# Patient Record
Sex: Male | Born: 1940
Health system: Southern US, Community
[De-identification: ages and names within clinical notes are randomized; demographics above are authoritative.]

## PROBLEM LIST (undated history)

## (undated) DIAGNOSIS — E785 Hyperlipidemia, unspecified: Secondary | ICD-10-CM

## (undated) DIAGNOSIS — L709 Acne, unspecified: Secondary | ICD-10-CM

## (undated) DIAGNOSIS — C4490 Unspecified malignant neoplasm of skin, unspecified: Secondary | ICD-10-CM

## (undated) HISTORY — PX: POLYPECTOMY: SHX149

## (undated) HISTORY — DX: Unspecified malignant neoplasm of skin, unspecified: C44.90

## (undated) HISTORY — DX: Hyperlipidemia, unspecified: E78.5

## (undated) HISTORY — PX: COLONOSCOPY: SHX174

## (undated) HISTORY — DX: Acne, unspecified: L70.9

## (undated) HISTORY — PX: APPENDECTOMY: SHX54

---

## 1994-01-22 HISTORY — PX: OTHER SURGICAL HISTORY: SHX169

## 1999-07-17 ENCOUNTER — Encounter: Admission: RE | Admit: 1999-07-17 | Discharge: 1999-07-17 | Payer: Self-pay | Admitting: Dermatology

## 1999-07-17 ENCOUNTER — Encounter: Payer: Self-pay | Admitting: Dermatology

## 2006-05-07 ENCOUNTER — Ambulatory Visit: Payer: Self-pay | Admitting: Family Medicine

## 2006-05-07 ENCOUNTER — Encounter: Payer: Self-pay | Admitting: Family Medicine

## 2006-05-07 DIAGNOSIS — K644 Residual hemorrhoidal skin tags: Secondary | ICD-10-CM | POA: Insufficient documentation

## 2006-05-07 DIAGNOSIS — Z85820 Personal history of malignant melanoma of skin: Secondary | ICD-10-CM

## 2006-05-13 ENCOUNTER — Ambulatory Visit: Payer: Self-pay | Admitting: Family Medicine

## 2006-05-13 LAB — CONVERTED CEMR LAB
ALT: 26 units/L (ref 0–40)
AST: 28 units/L (ref 0–37)
Albumin: 4.2 g/dL (ref 3.5–5.2)
Alkaline Phosphatase: 41 units/L (ref 39–117)
BUN: 11 mg/dL (ref 6–23)
Bilirubin, Direct: 0.2 mg/dL (ref 0.0–0.3)
CO2: 30 meq/L (ref 19–32)
Calcium: 9.1 mg/dL (ref 8.4–10.5)
Chloride: 105 meq/L (ref 96–112)
Cholesterol: 170 mg/dL (ref 0–200)
Creatinine, Ser: 0.6 mg/dL (ref 0.4–1.5)
GFR calc Af Amer: 175 mL/min
GFR calc non Af Amer: 145 mL/min
Glucose, Bld: 131 mg/dL — ABNORMAL HIGH (ref 70–99)
HDL: 44.9 mg/dL (ref >39.0)
LDL Cholesterol: 116 mg/dL — ABNORMAL HIGH (ref 0–99)
PSA: 0.95 ng/mL (ref 0.10–4.00)
Potassium: 4.3 meq/L (ref 3.5–5.1)
Sodium: 141 meq/L (ref 135–145)
TSH: 1.35 microintl units/mL (ref 0.35–5.50)
Total Bilirubin: 0.9 mg/dL (ref 0.3–1.2)
Total CHOL/HDL Ratio: 3.8
Total Protein: 7 g/dL (ref 6.0–8.3)
Triglycerides: 44 mg/dL (ref 0–149)
VLDL: 9 mg/dL (ref 0–40)

## 2006-06-18 ENCOUNTER — Ambulatory Visit: Payer: Self-pay | Admitting: Family Medicine

## 2006-06-18 LAB — CONVERTED CEMR LAB
Creatinine,U: 126.9 mg/dL
Glucose, Bld: 123 mg/dL — ABNORMAL HIGH (ref 70–99)
Hgb A1c MFr Bld: 5.7 % (ref 4.6–6.0)
Microalb Creat Ratio: 5.5 mg/g (ref 0.0–30.0)
Microalb, Ur: 0.7 mg/dL (ref 0.0–1.9)

## 2006-06-20 ENCOUNTER — Ambulatory Visit: Payer: Self-pay | Admitting: Family Medicine

## 2006-06-20 DIAGNOSIS — E785 Hyperlipidemia, unspecified: Secondary | ICD-10-CM

## 2007-02-24 ENCOUNTER — Ambulatory Visit: Payer: Self-pay | Admitting: Family Medicine

## 2007-02-26 ENCOUNTER — Ambulatory Visit: Payer: Self-pay | Admitting: Family Medicine

## 2007-02-26 LAB — CONVERTED CEMR LAB
ALT: 26 units/L (ref 0–53)
AST: 20 units/L (ref 0–37)
Albumin: 4.2 g/dL (ref 3.5–5.2)
Alkaline Phosphatase: 54 units/L (ref 39–117)
BUN: 16 mg/dL (ref 6–23)
Basophils Absolute: 0 10*3/uL (ref 0.0–0.1)
Basophils Relative: 0.4 % (ref 0.0–1.0)
Bilirubin, Direct: 0.1 mg/dL (ref 0.0–0.3)
CO2: 30 meq/L (ref 19–32)
Calcium: 9.3 mg/dL (ref 8.4–10.5)
Chloride: 106 meq/L (ref 96–112)
Cholesterol: 164 mg/dL (ref 0–200)
Creatinine, Ser: 0.6 mg/dL (ref 0.4–1.5)
Creatinine,U: 141.6 mg/dL
Eosinophils Absolute: 0.2 10*3/uL (ref 0.0–0.6)
Eosinophils Relative: 4.9 % (ref 0.0–5.0)
GFR calc Af Amer: 174 mL/min
GFR calc non Af Amer: 144 mL/min
Glucose, Bld: 149 mg/dL — ABNORMAL HIGH (ref 70–99)
HCT: 43.2 % (ref 39.0–52.0)
HDL: 38.5 mg/dL — ABNORMAL LOW (ref >39.0)
Hemoglobin: 14.8 g/dL (ref 13.0–17.0)
LDL Cholesterol: 115 mg/dL — ABNORMAL HIGH (ref 0–99)
Lymphocytes Relative: 31.5 % (ref 12.0–46.0)
MCHC: 34.3 g/dL (ref 30.0–36.0)
MCV: 89.6 fL (ref 78.0–100.0)
Microalb Creat Ratio: 9.2 mg/g (ref 0.0–30.0)
Microalb, Ur: 1.3 mg/dL (ref 0.0–1.9)
Monocytes Absolute: 0.4 10*3/uL (ref 0.2–0.7)
Monocytes Relative: 9.7 % (ref 3.0–11.0)
Neutro Abs: 2.6 10*3/uL (ref 1.4–7.7)
Neutrophils Relative %: 53.5 % (ref 43.0–77.0)
Platelets: 162 10*3/uL (ref 150–400)
Potassium: 4.3 meq/L (ref 3.5–5.1)
RBC: 4.82 M/uL (ref 4.22–5.81)
RDW: 11.8 % (ref 11.5–14.6)
Sodium: 142 meq/L (ref 135–145)
TSH: 1.65 microintl units/mL (ref 0.35–5.50)
Total Bilirubin: 0.9 mg/dL (ref 0.3–1.2)
Total CHOL/HDL Ratio: 4.3
Total Protein: 7.1 g/dL (ref 6.0–8.3)
Triglycerides: 54 mg/dL (ref 0–149)
VLDL: 11 mg/dL (ref 0–40)
WBC: 4.6 10*3/uL (ref 4.5–10.5)

## 2007-02-27 ENCOUNTER — Ambulatory Visit: Payer: Self-pay | Admitting: Family Medicine

## 2007-03-13 ENCOUNTER — Ambulatory Visit: Payer: Self-pay | Admitting: Family Medicine

## 2007-03-14 ENCOUNTER — Encounter (INDEPENDENT_AMBULATORY_CARE_PROVIDER_SITE_OTHER): Payer: Self-pay | Admitting: *Deleted

## 2007-05-23 ENCOUNTER — Ambulatory Visit: Payer: Self-pay | Admitting: Family Medicine

## 2007-05-23 LAB — CONVERTED CEMR LAB
BUN: 11 mg/dL (ref 6–23)
CO2: 30 meq/L (ref 19–32)
Calcium: 9.1 mg/dL (ref 8.4–10.5)
Chloride: 105 meq/L (ref 96–112)
Creatinine, Ser: 0.6 mg/dL (ref 0.4–1.5)
GFR calc Af Amer: 174 mL/min
GFR calc non Af Amer: 144 mL/min
Glucose, Bld: 135 mg/dL — ABNORMAL HIGH (ref 70–99)
Hgb A1c MFr Bld: 5.8 % (ref 4.6–6.0)
PSA: 0.72 ng/mL (ref 0.10–4.00)
Potassium: 4 meq/L (ref 3.5–5.1)
Sodium: 142 meq/L (ref 135–145)

## 2007-05-27 ENCOUNTER — Ambulatory Visit: Payer: Self-pay | Admitting: Family Medicine

## 2007-09-26 ENCOUNTER — Ambulatory Visit: Payer: Self-pay | Admitting: Family Medicine

## 2007-09-26 LAB — CONVERTED CEMR LAB
Glucose, Bld: 128 mg/dL — ABNORMAL HIGH (ref 70–99)
Hgb A1c MFr Bld: 5.9 % (ref 4.6–6.0)

## 2007-10-01 ENCOUNTER — Ambulatory Visit: Payer: Self-pay | Admitting: Family Medicine

## 2008-03-01 ENCOUNTER — Ambulatory Visit: Payer: Self-pay | Admitting: Family Medicine

## 2008-03-01 LAB — CONVERTED CEMR LAB
ALT: 21 units/L (ref 0–53)
AST: 19 units/L (ref 0–37)
Albumin: 4.2 g/dL (ref 3.5–5.2)
Alkaline Phosphatase: 46 units/L (ref 39–117)
BUN: 15 mg/dL (ref 6–23)
Bilirubin, Direct: 0.2 mg/dL (ref 0.0–0.3)
CO2: 29 meq/L (ref 19–32)
Calcium: 9.1 mg/dL (ref 8.4–10.5)
Chloride: 99 meq/L (ref 96–112)
Cholesterol: 176 mg/dL (ref 0–200)
Creatinine, Ser: 0.6 mg/dL (ref 0.4–1.5)
Creatinine,U: 201 mg/dL
GFR calc Af Amer: 174 mL/min
GFR calc non Af Amer: 144 mL/min
Glucose, Bld: 146 mg/dL — ABNORMAL HIGH (ref 70–99)
HDL: 49.4 mg/dL (ref >39.0)
Hgb A1c MFr Bld: 5.9 % (ref 4.6–6.0)
LDL Cholesterol: 116 mg/dL — ABNORMAL HIGH (ref 0–99)
Microalb Creat Ratio: 6.5 mg/g (ref 0.0–30.0)
Microalb, Ur: 1.3 mg/dL (ref 0.0–1.9)
PSA: 0.84 ng/mL (ref 0.10–4.00)
Potassium: 3.7 meq/L (ref 3.5–5.1)
Sodium: 137 meq/L (ref 135–145)
TSH: 1.51 microintl units/mL (ref 0.35–5.50)
Total Bilirubin: 1.7 mg/dL — ABNORMAL HIGH (ref 0.3–1.2)
Total CHOL/HDL Ratio: 3.6
Total Protein: 6.9 g/dL (ref 6.0–8.3)
Triglycerides: 51 mg/dL (ref 0–149)
VLDL: 10 mg/dL (ref 0–40)

## 2008-03-04 ENCOUNTER — Ambulatory Visit: Payer: Self-pay | Admitting: Family Medicine

## 2008-03-04 DIAGNOSIS — Z8639 Personal history of other endocrine, nutritional and metabolic disease: Secondary | ICD-10-CM | POA: Insufficient documentation

## 2008-03-04 DIAGNOSIS — E119 Type 2 diabetes mellitus without complications: Secondary | ICD-10-CM

## 2008-03-19 ENCOUNTER — Ambulatory Visit: Payer: Self-pay | Admitting: Family Medicine

## 2008-03-19 LAB — CONVERTED CEMR LAB
OCCULT 1: NEGATIVE
OCCULT 2: NEGATIVE
OCCULT 3: NEGATIVE

## 2008-03-22 ENCOUNTER — Encounter (INDEPENDENT_AMBULATORY_CARE_PROVIDER_SITE_OTHER): Payer: Self-pay | Admitting: *Deleted

## 2008-04-12 ENCOUNTER — Ambulatory Visit: Payer: Self-pay | Admitting: Family Medicine

## 2008-04-12 LAB — CONVERTED CEMR LAB
ALT: 21 units/L (ref 0–53)
AST: 17 units/L (ref 0–37)

## 2008-04-19 ENCOUNTER — Telehealth: Payer: Self-pay | Admitting: Family Medicine

## 2008-06-07 ENCOUNTER — Ambulatory Visit: Payer: Self-pay | Admitting: Family Medicine

## 2008-06-07 LAB — CONVERTED CEMR LAB
ALT: 21 units/L (ref 0–53)
AST: 18 units/L (ref 0–37)
VLDL: 9.6 mg/dL (ref 0.0–40.0)

## 2008-06-10 ENCOUNTER — Ambulatory Visit: Payer: Self-pay | Admitting: Family Medicine

## 2008-06-30 ENCOUNTER — Encounter: Admission: RE | Admit: 2008-06-30 | Discharge: 2008-06-30 | Payer: Self-pay | Admitting: Family Medicine

## 2008-06-30 ENCOUNTER — Ambulatory Visit: Payer: Self-pay | Admitting: Family Medicine

## 2008-06-30 DIAGNOSIS — M6740 Ganglion, unspecified site: Secondary | ICD-10-CM | POA: Insufficient documentation

## 2008-07-21 ENCOUNTER — Ambulatory Visit: Payer: Self-pay | Admitting: Family Medicine

## 2008-09-01 ENCOUNTER — Telehealth: Payer: Self-pay | Admitting: Family Medicine

## 2008-09-08 ENCOUNTER — Ambulatory Visit: Payer: Self-pay | Admitting: Family Medicine

## 2008-09-08 LAB — CONVERTED CEMR LAB
ALT: 23 units/L (ref 0–53)
HDL: 50.7 mg/dL (ref >39.00)
Total CHOL/HDL Ratio: 3

## 2008-09-14 ENCOUNTER — Ambulatory Visit: Payer: Self-pay | Admitting: Family Medicine

## 2008-09-15 ENCOUNTER — Encounter: Payer: Self-pay | Admitting: Family Medicine

## 2009-03-29 ENCOUNTER — Ambulatory Visit: Payer: Self-pay | Admitting: Family Medicine

## 2009-03-29 LAB — CONVERTED CEMR LAB
Albumin: 4.3 g/dL (ref 3.5–5.2)
BUN: 11 mg/dL (ref 6–23)
Basophils Absolute: 0.1 10*3/uL (ref 0.0–0.1)
CO2: 29 meq/L (ref 19–32)
Chloride: 106 meq/L (ref 96–112)
Cholesterol: 138 mg/dL (ref 0–200)
Creatinine,U: 61.7 mg/dL
GFR calc non Af Amer: 142.92 mL/min (ref >60)
Glucose, Bld: 188 mg/dL — ABNORMAL HIGH (ref 70–99)
HCT: 43.8 % (ref 39.0–52.0)
Hgb A1c MFr Bld: 6.2 % (ref 4.6–6.5)
Lymphs Abs: 1.4 10*3/uL (ref 0.7–4.0)
MCHC: 33.6 g/dL (ref 30.0–36.0)
MCV: 92.8 fL (ref 78.0–100.0)
Microalb Creat Ratio: 11.3 mg/g (ref 0.0–30.0)
Monocytes Absolute: 0.5 10*3/uL (ref 0.1–1.0)
Platelets: 143 10*3/uL — ABNORMAL LOW (ref 150.0–400.0)
Potassium: 4.2 meq/L (ref 3.5–5.1)
RDW: 12.4 % (ref 11.5–14.6)
TSH: 2.32 microintl units/mL (ref 0.35–5.50)
Total Bilirubin: 0.9 mg/dL (ref 0.3–1.2)
VLDL: 16.4 mg/dL (ref 0.0–40.0)

## 2009-04-04 ENCOUNTER — Ambulatory Visit: Payer: Self-pay | Admitting: Family Medicine

## 2009-04-20 ENCOUNTER — Encounter (INDEPENDENT_AMBULATORY_CARE_PROVIDER_SITE_OTHER): Payer: Self-pay | Admitting: *Deleted

## 2009-04-20 ENCOUNTER — Ambulatory Visit: Payer: Self-pay | Admitting: Family Medicine

## 2009-04-20 LAB — CONVERTED CEMR LAB: OCCULT 3: NEGATIVE

## 2009-08-30 ENCOUNTER — Encounter (INDEPENDENT_AMBULATORY_CARE_PROVIDER_SITE_OTHER): Payer: Self-pay | Admitting: *Deleted

## 2009-10-04 ENCOUNTER — Ambulatory Visit: Payer: Self-pay | Admitting: Family Medicine

## 2009-10-04 ENCOUNTER — Encounter (INDEPENDENT_AMBULATORY_CARE_PROVIDER_SITE_OTHER): Payer: Self-pay | Admitting: *Deleted

## 2009-10-05 LAB — CONVERTED CEMR LAB
ALT: 25 units/L (ref 0–53)
AST: 22 units/L (ref 0–37)
BUN: 14 mg/dL (ref 6–23)
Bilirubin, Direct: 0.2 mg/dL (ref 0.0–0.3)
CO2: 31 meq/L (ref 19–32)
Chloride: 104 meq/L (ref 96–112)
Creatinine, Ser: 0.6 mg/dL (ref 0.4–1.5)
LDL Cholesterol: 104 mg/dL — ABNORMAL HIGH (ref 0–99)
Potassium: 4.7 meq/L (ref 3.5–5.1)
Total CHOL/HDL Ratio: 4
Total Protein: 7.1 g/dL (ref 6.0–8.3)
Triglycerides: 43 mg/dL (ref 0.0–149.0)

## 2009-12-08 ENCOUNTER — Encounter: Payer: Self-pay | Admitting: Family Medicine

## 2010-02-21 NOTE — Letter (Signed)
Summary: Results Follow up Letter  [place] at [facility]  [address] [place], [place] [address]   Phone: [phone]  Fax: [phone]    04/20/2009 MRN: [id number]    Roger [name] [address] [place], [place]  [address]    Dear [name],  The following are the results of your recent test(s):  Test         Result    Pap Smear:        Normal _____  Not Normal _____ Comments: ______________________________________________________ Cholesterol: LDL(Bad cholesterol):         Your goal is less than:         HDL (Good cholesterol):       Your goal is more than: Comments:  ______________________________________________________ Mammogram:        Normal _____  Not Normal _____ Comments:  ___________________________________________________________________ Hemoccult:        Normal __X___  Not normal _______ Comments:    Yearly follow up is recommended.   _____________________________________________________________________ Other Tests:    We routinely do not discuss normal results over the telephone.  If you desire a copy of the results, or you have any questions about this information we can discuss them at your next office visit.   Sincerely,    Arta Silence, M.D.  RNS:lsf

## 2010-02-21 NOTE — Miscellaneous (Signed)
  Clinical Lists Changes  Observations: Added new observation of COLONNXTDUE: 03/2012 (10/04/2009 8:57) Added new observation of COLONOSCOPY: Done (03/23/2002 9:00)      Preventive Care Screening  Colonoscopy:    Date:  03/23/2002    Next Due:  03/2012    Results:  Done

## 2010-02-21 NOTE — Assessment & Plan Note (Signed)
Summary: CPX//CYD   Vital Signs:  Patient profile:   70 year old male Weight:      186.75 pounds Temp:     98.7 degrees F oral Pulse rate:   68 / minute Pulse rhythm:   regular BP sitting:   124 / 74  (left arm) Cuff size:   regular  Vitals Entered By: Sydell Axon LPN (April 04, 2009 10:47 AM) CC: 30 Minute checkup, hemoccult cards given to patient, declined tetanus vaccine   History of Present Illness: Pt here for followup. He declines Td, says he has no risk of cutting himself. His granddaughter tells him he is fat. He has started avoiding calories. He wants to decrease his stomach. hew has a gizmo from the TV. He plans to avoid potatoes and bread.  Preventive Screening-Counseling & Management  Alcohol-Tobacco     Alcohol drinks/day: 0     Alcohol type: beer     Smoking Status: never     Passive Smoke Exposure: no  Caffeine-Diet-Exercise     Caffeine use/day: 0     Does Patient Exercise: yes     Type of exercise: walking some.     Times/week: 3  Problems Prior to Update: 1)  Ganglion Cyst, L A/c Shoulder Joint.  (ICD-727.43) 2)  Otitis Externa, Acute, Bilateral  (ICD-380.12) 3)  Aodm  (ICD-250.00) 4)  Special Screening Malignant Neoplasm of Prostate  (ICD-V76.44) 5)  Special Screening Malig Neoplasms Other Sites  (ICD-V76.49) 6)  Health Maintenance Exam  (ICD-V70.0) 7)  Hyperlipidemia  (ICD-272.4) 8)  Hemorrhoids, External  (ICD-455.3) 9)  Hx, Personal, Malignancy, Skin Melanoma  (ICD-V10.82)  Medications Prior to Update: 1)  Pravastatin Sodium 40 Mg Tabs (Pravastatin Sodium) .... 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 Eyes:  Denies blurring, discharge, and eye pain. ENT:  Denies decreased hearing, earache, and ringing in ears. CV:  Denies chest pain or discomfort, fainting, fatigue, palpitations, shortness of breath with exertion, swelling of feet, and swelling of hands. Resp:  Denies cough, shortness of breath, and wheezing. GI:  Complains of hemorrhoids; denies abdominal pain, bloody stools, change in bowel habits, constipation, dark tarry stools, diarrhea, indigestion, loss of appetite, nausea, vomiting, vomiting blood, and yellowish skin color. GU:  Denies dysuria, nocturia, urinary frequency, and urinary hesitancy. MS:  Denies joint pain, loss of strength, mid back pain, muscle, cramps, and stiffness. Derm:  Complains of rash; denies dryness, itching, and lesion(s); sees Dr Terri Piedra. Has acne. Neuro:  Denies numbness, poor balance, tingling, and tremors.  Physical Exam  General:  Well-developed,well-nourished,in no acute distress; alert,appropriate and cooperative throughout examination,  overweight abdominally. Head:  Normocephalic and atraumatic without obvious abnormalities. No apparent alopecia or balding. Eyes:  Conjunctiva clear bilaterally.  Ears:  External ear exam shows no significant lesions or deformities.  Otoscopic examination reveals clear canals, tympanic membranes are intact  bilaterally without bulging, retraction, inflammation or discharge. Hearing is grossly normal bilaterally. Nose:  External nasal examination shows no deformity or inflammation. Nasal mucosa are pink and moist without lesions or exudates. Mouth:  Oral mucosa and oropharynx without lesions or exudates.  Teeth in good repair. Neck:  No deformities, masses, or tenderness noted. Chest Wall:  No deformities, masses, tenderness or gynecomastia noted. Breasts:  No masses or gynecomastia noted Lungs:  Normal respiratory effort, chest expands symmetrically. Lungs are clear to auscultation, no crackles or wheezes. Heart:  Normal rate and regular rhythm. S1 and S2 normal without gallop, murmur, click, rub or other extra sounds. Abdomen:  Bowel sounds positive,abdomen soft and non-tender without masses, organomegaly or hernias noted. Protuberant without Panniculous. Rectal:  No external abnormalities noted. Normal sphincter tone. No rectal masses or tenderness. Ext hemms moderately inflamed. Genitalia:  Testes bilaterally descended without nodularity, tenderness or masses. No scrotal masses or lesions. No penis lesions or urethral discharge. Prostate:  Prostate gland firm and smooth, no enlargement, nodularity, tenderness, mass, asymmetry or induration. 20-30gms. Msk:  No deformity or scoliosis noted of thoracic or lumbar spine.  L shoulder symmetrical again. Pulses:  R and L carotid,radial,femoral,dorsalis pedis and posterior tibial pulses are full and equal bilaterally Extremities:  No clubbing, cyanosis, edema, or deformity noted with normal full range of motion of all joints.   Neurologic:  No cranial nerve deficits noted. Station and gait are normal. Sensory, motor and coordinative functions appear intact. Skin:  Intact without suspicious lesions or rashes except mild acne. Cervical Nodes:  No lymphadenopathy noted Inguinal Nodes:  No significant adenopathy Psych:  Cognition and judgment appear intact.  Alert and cooperative with normal attention span and concentration. No apparent delusions, illusions, hallucinations   Impression & Recommendations:  Problem # 1:  AODM (ICD-250.00) Assessment Unchanged Stable but acutely elevated.  Problem # 2:  SPECIAL SCREENING MALIGNANT NEOPLASM OF PROSTATE (ICD-V76.44) Assessment: Unchanged Stable exam and PSA.  Problem # 3:  HYPERLIPIDEMIA (ICD-272.4) Assessment: Improved  Good control. Cont curr meds. His updated medication list for this problem includes:    Pravastatin Sodium 40 Mg Tabs (Pravastatin sodium) ..... One tab by mouth at night  Labs Reviewed: SGOT: 23 (03/29/2009)   SGPT: 25 (03/29/2009)   HDL:49.80 (03/29/2009), 50.70 (09/08/2008)  LDL:72 (03/29/2009), 106 (16/10/9602)  Chol:138 (03/29/2009), 163 (09/08/2008)  Trig:82.0 (03/29/2009), 32.0 (09/08/2008)  Problem # 4:  HEMORRHOIDS, EXTERNAL (ICD-455.3) Assessment: Unchanged  Complete Medication List: 1)  Pravastatin Sodium 40 Mg Tabs (Pravastatin sodium) .... One tab by mouth at night  Current Allergies (reviewed today): No known allergies

## 2010-02-21 NOTE — Assessment & Plan Note (Signed)
Summary: F/U/DR SCHALLER'S PT/CLE   Vital Signs:  Patient profile:   70 year old male Height:      65 inches Weight:      186.75 pounds BMI:     31.19 Temp:     98.6 degrees F oral Pulse rate:   76 / minute Pulse rhythm:   regular BP sitting:   132 / 80  (left arm) Cuff size:   large  Vitals Entered By: Delilah Shan CMA Duncan Dull) (October 04, 2009 8:15 AM) CC: Follow-up visit per RNS   History of Present Illness: Elevated Cholesterol: Using medications without problems: yes Muscle aches: no Other complaints: no  Diabetes:  Using medications without difficulties:not on meds Hypoglycemic episodes: no symptoms  Hyperglycemic episodes:no symptoms  Feet problems:no Blood Sugars averaging:not checked eye exam within last year: has follow up pending soon  Allergies: No Known Drug Allergies  Past History:  Past Surgical History: Last updated: 05/07/2006 Appy 7th grade Melanoma excision of back (Dr Terri Piedra) 1996  Family History: Last updated: 10/04/2009 Father- estranged Mother dec 63 Lung Ca (smoker) Brother A- no contact Brother A- no contact Brother A - no contact Sister A- no contact  Social History: Last updated: 10/04/2009 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 Father- estranged Mother dec 63 Lung Ca (smoker) Brother A- no contact Brother A- no contact Brother A - no contact Sister A- no contact  Social History: Reviewed history from 05/07/2006 and no changes required. 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  Otherwise negative.    Physical Exam  General:  GEN: nad, alert and oriented HEENT: mucous membranes  moist NECK: supple w/o LA CV: rrr.  no murmur PULM: ctab, no inc wob ABD: soft, +bs EXT: no edema SKIN: no acute rash, healed scar noted on back  Diabetes Management Exam:    Foot Exam (with socks and/or shoes not present):       Sensory-Pinprick/Light touch:          Left medial foot (L-4): normal          Left dorsal foot (L-5): normal          Left lateral foot (S-1): normal          Right medial foot (L-4): normal          Right dorsal foot (L-5): normal          Right lateral foot (S-1): normal       Sensory-Monofilament:          Left foot: normal          Right foot: normal       Inspection:          Left foot: normal          Right foot: normal       Nails:          Left foot: normal          Right foot: normal   Impression & Recommendations:  Problem # 1:  AODM (ICD-250.00) check labs and see instructions.  >25 min spent with patient, at least half of which was spent on counseling re:dm2.  Orders:  TLB-BMP (Basic Metabolic Panel-BMET) (80048-METABOL) TLB-Hepatic/Liver Function Pnl (80076-HEPATIC) TLB-Lipid Panel (80061-LIPID) TLB-A1C / Hgb A1C (Glycohemoglobin) (83036-A1C)  Problem # 2:  HYPERLIPIDEMIA (ICD-272.4) Continue meds and check labs.   His updated medication list for this problem includes:    Pravastatin Sodium 40 Mg Tabs (Pravastatin sodium) ..... One tab by mouth at night  Complete Medication List: 1)  Pravastatin Sodium 40 Mg Tabs (Pravastatin sodium) .... One tab by mouth at night 2)  Doxycycline Hyclate 100 Mg Tabs (Doxycycline hyclate) .... Take 1 or 2 tablets by mouth once daily or acne flare ups  Patient Instructions: 1)  Walk at least 3 times a week. 2)  Cut down on bread, beer, and sweet tea.  3)  You can get your results through our phone system.  Follow the instructions on the blue card.  4)  I would get a physical in 6 months.   5)  Take care.  Glad to see you.   Current Allergies (reviewed today): No known allergies   Appended  Document: F/U/DR [facility] PT/CLE Flu Vaccine Consent Questions     Do you have a history of severe allergic reactions to this vaccine? no    Any prior history of allergic reactions to egg and/or gelatin? no    Do you have a sensitivity to the preservative Thimersol? no    Do you have a past history of Guillan-Barre Syndrome? no    Do you currently have an acute febrile illness? no    Have you ever had a severe reaction to latex? no    Vaccine information given and explained to patient? yes    Are you currently pregnant? no    Lot Number:[id number]   Exp Date:07/22/2010   Site Given  Left Deltoid IM Lugene Fuquay CMA (AAMA)  October 04, 2009 8:54 AM

## 2010-02-21 NOTE — Letter (Signed)
Summary: Nadara Eaton letter  [place] at [facility]  [address] [place], [place] [address]   Phone: [phone]  Fax: [phone]       08/30/2009 MRN: [id number]  Roger [name] [address] [place], [place]  [address]  Dear [name],  [facility] [place] [place] announce the retirement of Arta Silence, M.D., from full-time practice at the [facility] office effective July 21, 2009 and his plans of returning part-time.  It is important to Dr. Hetty Ely and to our practice that you understand that [facility] has seven physicians in our office for your health care needs.  We will continue to offer the same exceptional care that you have today.    Dr. Hetty Ely has spoken to many of you about his plans for retirement and returning part-time in the fall.   We will continue to work with you through the transition to schedule appointments for you in the office and meet the high standards that [facility] is committed to.   Again, it is with great pleasure that we share the news that Dr. Hetty Ely will return to [facility] in October of 2011 with a reduced schedule.    If you have any questions, or would like to request an appointment with one of our physicians, please call us at [phone] and press the option for Scheduling an appointment.  We take pleasure in providing you with excellent patient care and look forward to seeing you at your next office visit.  Our [facility] Physicians are:  Tillman Abide, M.D. Laurita Quint, M.D. Roxy Manns, M.D. Kerby Nora, M.D. Hannah Beat, M.D. Ruthe Mannan, M.D. We proudly welcomed Raechel Ache, M.D. and Eustaquio Boyden, M.D. to the practice in July/August 2011.  Sincerely,  [facility]

## 2010-03-31 ENCOUNTER — Telehealth (INDEPENDENT_AMBULATORY_CARE_PROVIDER_SITE_OTHER): Payer: Self-pay | Admitting: *Deleted

## 2010-04-03 ENCOUNTER — Other Ambulatory Visit: Payer: Self-pay | Admitting: Family Medicine

## 2010-04-03 ENCOUNTER — Other Ambulatory Visit (INDEPENDENT_AMBULATORY_CARE_PROVIDER_SITE_OTHER): Payer: Medicare Other

## 2010-04-03 ENCOUNTER — Encounter: Payer: Self-pay | Admitting: *Deleted

## 2010-04-03 DIAGNOSIS — E785 Hyperlipidemia, unspecified: Secondary | ICD-10-CM

## 2010-04-03 DIAGNOSIS — E119 Type 2 diabetes mellitus without complications: Secondary | ICD-10-CM

## 2010-04-03 DIAGNOSIS — Z125 Encounter for screening for malignant neoplasm of prostate: Secondary | ICD-10-CM

## 2010-04-03 LAB — LIPID PANEL
Cholesterol: 137 mg/dL (ref 0–200)
VLDL: 8.8 mg/dL (ref 0.0–40.0)

## 2010-04-03 LAB — HEPATIC FUNCTION PANEL
AST: 21 U/L (ref 0–37)
Albumin: 4.7 g/dL (ref 3.5–5.2)
Total Protein: 7.2 g/dL (ref 6.0–8.3)

## 2010-04-03 LAB — BASIC METABOLIC PANEL
BUN: 14 mg/dL (ref 6–23)
Calcium: 9.2 mg/dL (ref 8.4–10.5)
GFR: 164.41 mL/min (ref >60.00)
Glucose, Bld: 120 mg/dL — ABNORMAL HIGH (ref 70–99)

## 2010-04-03 LAB — PSA: PSA: 0.94 ng/mL (ref 0.10–4.00)

## 2010-04-04 NOTE — Progress Notes (Signed)
----   Converted from flag ---- ---- 03/30/2010 1:48 PM, Crawford Givens MD wrote: psa v76.44 cmet/lipid/a1c 250.00  ---- 03/30/2010 7:54 AM, Liane Comber CMA (AAMA) wrote: Lab orders please! Good Morning! This pt is scheduled for cpx labs Monday, which labs to draw and dx codes to use? Thanks Tasha ------------------------------

## 2010-04-05 ENCOUNTER — Encounter: Payer: Self-pay | Admitting: Family Medicine

## 2010-04-05 ENCOUNTER — Encounter (INDEPENDENT_AMBULATORY_CARE_PROVIDER_SITE_OTHER): Payer: Medicare Other | Admitting: Family Medicine

## 2010-04-05 DIAGNOSIS — E785 Hyperlipidemia, unspecified: Secondary | ICD-10-CM

## 2010-04-05 DIAGNOSIS — Z125 Encounter for screening for malignant neoplasm of prostate: Secondary | ICD-10-CM

## 2010-04-05 DIAGNOSIS — E119 Type 2 diabetes mellitus without complications: Secondary | ICD-10-CM

## 2010-04-06 ENCOUNTER — Encounter: Payer: Self-pay | Admitting: Family Medicine

## 2010-04-11 ENCOUNTER — Encounter: Payer: Self-pay | Admitting: *Deleted

## 2010-04-11 NOTE — Assessment & Plan Note (Signed)
Summary: CPX/CLE   Vital Signs:  Patient profile:   70 year old male Height:      65 inches Weight:      177.75 pounds BMI:     29.69 Temp:     98.8 degrees F oral Pulse rate:   72 / minute Pulse rhythm:   regular BP sitting:   104 / 60  (left arm) Cuff size:   large  Vitals Entered By: Delilah Shan CMA Inocente Krach Dull) (April 05, 2010 1:49 PM) CC: CPX   History of Present Illness: Elevated Cholesterol: Using medications without problems:yes Muscle aches: no Other complaints:no  Feeling well w/o other complaints.  Asking for me to check his ears.  No hearing trouble.    Diabetes:  Using medications without difficulties:no meds Hypoglycemic episodes:no symptoms  Hyperglycemic episodes:no symptoms  Feet problems:no Blood Sugars averaging: not checked.   eye exam within last year: yes  Allergies: No Known Drug Allergies  Past History:  Past Surgical History: Last updated: 05/07/2006 Appy 7th grade Melanoma excision of back (Dr Terri Piedra) 1996  Past Medical History: HLD Acne- [place] DM2  Family History: Reviewed history from 10/04/2009 and no changes required. Father- estranged Mother dec 63 Lung Ca (smoker) Brother A- no contact Brother A- no contact Brother A - no contact Sister A- no contact  Social History: Reviewed history from 10/04/2009 and no changes required. 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  Otherwise negative.    Physical Exam  General:  GEN: nad, alert and oriented HEENT: mucous membranes moist NECK: supple w/o LA CV: rrr.  no murmur PULM: ctab, no inc wob ABD: soft, +bs EXT: no edema SKIN: no acute rash  Rectal:  No rectal masses or tenderness.  +external hemorrhoid(s).   Prostate:  Prostate gland firm and smooth, no enlargement, nodularity, tenderness, mass, asymmetry or  induration.  Diabetes Management Exam:    Foot Exam (with socks and/or shoes not present):       Sensory-Pinprick/Light touch:          Left medial foot (L-4): normal          Left dorsal foot (L-5): normal          Left lateral foot (S-1): normal          Right medial foot (L-4): normal          Right dorsal foot (L-5): normal          Right lateral foot (S-1): normal       Sensory-Monofilament:          Left foot: normal          Right foot: normal       Inspection:          Left foot: normal          Right foot: normal       Nails:          Left foot: normal          Right foot: normal   Impression & Recommendations:  Problem # 1:  HYPERLIPIDEMIA (ICD-272.4) labs reviewed with patient.  At goal.  continue diet and exercise.  Losing weight.   His updated medication list for this problem includes:    Pravastatin Sodium 40 Mg Tabs (Pravastatin sodium) ..... One tab by mouth at night  Orders: Prescription Created Electronically [phone])  Problem # 2:  AODM (ICD-250.00) labs reviewed with patient.  At goal.  continue diet and exercise.  Losing weight.   See plan.   Problem # 3:  Screening PSA (ICD-V76.44) normal DRE and psa not elevated.  consider recheck next year.   Complete Medication List: 1)  Pravastatin Sodium 40 Mg Tabs (Pravastatin sodium) .... One tab by mouth at night 2)  Doxycycline Hyclate 100 Mg Tabs (Doxycycline hyclate) .... Take 1 or 2 tablets by mouth once daily or acne flare ups  Patient Instructions: 1)  Check with your insurance to see if they will cover the shingles shot. I would get the singles shot, along with a tetanus and pneumonia shot. 2)  I would get another flu shot in the fall. 3)  Don't change your meds.   4)  Keep exercising.   5)  Take care and enjoy the beach.   6)  follow up in 6months.  .  A1c and lipid before OV.  250.00 Prescriptions: PRAVASTATIN SODIUM 40 MG TABS (PRAVASTATIN SODIUM) one tab by mouth at night  #90 x 3    Entered and Authorized by:   Crawford Givens MD   Signed by:   Crawford Givens MD on 04/05/2010   Method used:   Electronically to        [facility] [phone]* (retail)       [address]       [place], [place]  [address]       Ph: [phone]       Fax: [phone]   RxID:   [id number]    Orders Added: 1)  Est. Patient Level IV [27253] 2)  Prescription Created Electronically [phone]    Current Allergies (reviewed today): No known allergies    Prevention & Chronic Care Immunizations   Influenza vaccine: Fluvax 3+  (10/04/2009)    Tetanus booster: Not documented    Pneumococcal vaccine: Not documented    H. zoster vaccine: Not documented  Colorectal Screening   Hemoccult: negative  (04/20/2009)    Colonoscopy: Done  (03/23/2002)   Colonoscopy due: 03/2012  Other Screening   PSA: 0.94  (04/03/2010)   Smoking status: never  (04/04/2009)  Diabetes Mellitus   HgbA1C: 6.0  (04/03/2010)    Eye exam: normal  (12/02/2007)    Foot exam: yes  (04/05/2010)   High risk foot: Not documented   Foot care education: Not documented    Urine microalbumin/creatinine ratio: 11.3  (03/29/2009)  Lipids   Total Cholesterol: 137  (04/03/2010)   LDL: 82  (04/03/2010)   LDL Direct: Not documented   HDL: 46.40  (04/03/2010)   Triglycerides: 44.0  (04/03/2010)    SGOT (AST): 21  (04/03/2010)   SGPT (ALT): 24  (04/03/2010)   Alkaline phosphatase: 46  (04/03/2010)   Total bilirubin: 1.0  (04/03/2010)  Self-Management Support :    Diabetes self-management support: Not documented    Lipid self-management support: Not documented

## 2010-04-11 NOTE — Letter (Signed)
Summary: Eye exam  Eye exam   Imported By: Kassie Mends 04/05/2010 09:07:01  _____________________________________________________________________  External Attachment:    Type:   Image     Comment:   External Document  Appended Document: Eye exam    Clinical Lists Changes  Observations: Added new observation of DMEYEEXAMNXT: 12/2010 (04/05/2010 16:43) Added new observation of DIAB EYE EX: no retinopathy  (12/08/2009 16:44)       Diabetic Eye Exam  Procedure date:  12/08/2009  Findings:      no retinopathy   Procedures Next Due Date:    Diabetic Eye Exam: 12/2010

## 2010-04-20 NOTE — Letter (Signed)
Summary: [place] [facility]: Immunoassay Fecal Occult Blood (iFOB) Order Form  [place] at [facility]  [address] [place], [place] [address]   Phone: [phone]  Fax: [phone]      [place] [facility]: Immunoassay Fecal Occult Blood (iFOB) Order Form   April 11, 2010 MRN: [id number]   Roger [name] May 05, 1940   Physicican Name:_________Duncan________________  Diagnosis Code:________v76.49__________________      Liane Comber CMA (AAMA)

## 2010-04-25 ENCOUNTER — Other Ambulatory Visit: Payer: Medicare Other

## 2010-04-25 ENCOUNTER — Other Ambulatory Visit (INDEPENDENT_AMBULATORY_CARE_PROVIDER_SITE_OTHER): Payer: Medicare Other | Admitting: Family Medicine

## 2010-04-25 DIAGNOSIS — Z1211 Encounter for screening for malignant neoplasm of colon: Secondary | ICD-10-CM

## 2010-04-27 ENCOUNTER — Encounter: Payer: Self-pay | Admitting: *Deleted

## 2010-09-19 ENCOUNTER — Encounter: Payer: Self-pay | Admitting: Family Medicine

## 2010-10-02 ENCOUNTER — Other Ambulatory Visit: Payer: Medicare Other

## 2010-10-04 ENCOUNTER — Other Ambulatory Visit: Payer: Medicare Other

## 2010-10-04 ENCOUNTER — Ambulatory Visit: Payer: Medicare Other | Admitting: Family Medicine

## 2010-10-09 ENCOUNTER — Ambulatory Visit: Payer: Medicare Other | Admitting: Family Medicine

## 2010-10-17 ENCOUNTER — Other Ambulatory Visit: Payer: Medicare Other

## 2010-10-24 ENCOUNTER — Ambulatory Visit: Payer: Medicare Other | Admitting: Family Medicine

## 2010-10-24 ENCOUNTER — Other Ambulatory Visit: Payer: Medicare Other

## 2010-10-27 ENCOUNTER — Ambulatory Visit: Payer: Medicare Other | Admitting: Family Medicine

## 2010-10-30 ENCOUNTER — Other Ambulatory Visit: Payer: Medicare Other

## 2010-11-01 ENCOUNTER — Ambulatory Visit: Payer: Medicare Other | Admitting: Family Medicine

## 2010-11-27 ENCOUNTER — Other Ambulatory Visit: Payer: Medicare Other

## 2010-12-04 ENCOUNTER — Ambulatory Visit: Payer: Medicare Other | Admitting: Family Medicine

## 2010-12-11 ENCOUNTER — Other Ambulatory Visit: Payer: Medicare Other

## 2010-12-12 ENCOUNTER — Ambulatory Visit: Payer: Medicare Other | Admitting: Family Medicine

## 2010-12-25 ENCOUNTER — Other Ambulatory Visit: Payer: Medicare Other

## 2010-12-26 ENCOUNTER — Ambulatory Visit: Payer: Medicare Other | Admitting: Family Medicine

## 2011-01-29 ENCOUNTER — Other Ambulatory Visit (INDEPENDENT_AMBULATORY_CARE_PROVIDER_SITE_OTHER): Payer: Medicare Other

## 2011-01-29 DIAGNOSIS — E119 Type 2 diabetes mellitus without complications: Secondary | ICD-10-CM | POA: Diagnosis not present

## 2011-01-29 LAB — HEMOGLOBIN A1C: Hgb A1c MFr Bld: 6.1 % (ref 4.6–6.5)

## 2011-01-29 LAB — LIPID PANEL
Cholesterol: 174 mg/dL (ref 0–200)
HDL: 46.7 mg/dL (ref >39.00)
LDL Cholesterol: 114 mg/dL — ABNORMAL HIGH (ref 0–99)
Triglycerides: 69 mg/dL (ref 0.0–149.0)
VLDL: 13.8 mg/dL (ref 0.0–40.0)

## 2011-01-30 ENCOUNTER — Ambulatory Visit (INDEPENDENT_AMBULATORY_CARE_PROVIDER_SITE_OTHER): Payer: Medicare Other | Admitting: Family Medicine

## 2011-01-30 ENCOUNTER — Encounter: Payer: Self-pay | Admitting: Family Medicine

## 2011-01-30 DIAGNOSIS — E119 Type 2 diabetes mellitus without complications: Secondary | ICD-10-CM | POA: Diagnosis not present

## 2011-01-30 DIAGNOSIS — D235 Other benign neoplasm of skin of trunk: Secondary | ICD-10-CM | POA: Diagnosis not present

## 2011-01-30 DIAGNOSIS — C44319 Basal cell carcinoma of skin of other parts of face: Secondary | ICD-10-CM | POA: Diagnosis not present

## 2011-01-30 DIAGNOSIS — D2330 Other benign neoplasm of skin of unspecified part of face: Secondary | ICD-10-CM | POA: Diagnosis not present

## 2011-01-30 DIAGNOSIS — E785 Hyperlipidemia, unspecified: Secondary | ICD-10-CM | POA: Diagnosis not present

## 2011-01-30 DIAGNOSIS — L738 Other specified follicular disorders: Secondary | ICD-10-CM | POA: Diagnosis not present

## 2011-01-30 DIAGNOSIS — Z85820 Personal history of malignant melanoma of skin: Secondary | ICD-10-CM | POA: Diagnosis not present

## 2011-01-30 MED ORDER — PRAVASTATIN SODIUM 40 MG PO TABS: 40.0000 mg | ORAL_TABLET | Freq: Every day | ORAL | Status: DC

## 2011-01-30 NOTE — Patient Instructions (Signed)
Schedule a physical for the summer with labs ahead of time.   Keep walking and start back on the cholesterol medicine.

## 2011-01-30 NOTE — Progress Notes (Signed)
Diabetes:  Using medications without difficulties: no meds.  Hypoglycemic episodes: no sx Hyperglycemic episodes: no sx Feet problems: no Blood Sugars averaging: not checked eye exam within last year:yes A1c 6.1  Elevated Cholesterol: Using medications without problems:yes, but see below Muscle aches: no Diet compliance: yes Exercise:yes Was off statin for about 3 weeks due to cold sx.  He didn't know if it was safe to continue statin with cold meds.  I told him it was okay to hold if aches, but continue o/w.  Less walking with schedule changes, he was moving and this altered his routine.  Will start back on walking soon.   Had a few facial lesions cut off by Dr. Terri Piedra.    PMH and SH reviewed  Meds, vitals, and allergies reviewed.   ROS: See HPI.  Otherwise negative.    GEN: nad, alert and oriented HEENT: mucous membranes moist, bandaid on chin and L side of face NECK: supple w/o LA CV: rrr. PULM: ctab, no inc wob ABD: soft, +bs EXT: no edema SKIN: no acute rash  Diabetic foot exam: Normal inspection No skin breakdown No calluses  Normal DP pulses Normal sensation to light touch and monofilament Nails normal

## 2011-01-31 NOTE — Assessment & Plan Note (Addendum)
Elevation likely due to coming off statin transiently.  Okay to restart.  Keep walking.  Should do well.  >25 min spent with face to face with patient, >50% counseling and/or coordinating care

## 2011-01-31 NOTE — Assessment & Plan Note (Signed)
Diet and exercise controlled, continue with diet and walking.  No meds.  Recheck A1c later 2013.  He agrees.

## 2011-08-06 ENCOUNTER — Encounter: Payer: Medicare Other | Admitting: Family Medicine

## 2011-08-13 DIAGNOSIS — L570 Actinic keratosis: Secondary | ICD-10-CM | POA: Diagnosis not present

## 2011-08-13 DIAGNOSIS — Z85820 Personal history of malignant melanoma of skin: Secondary | ICD-10-CM | POA: Diagnosis not present

## 2011-08-13 DIAGNOSIS — Z85828 Personal history of other malignant neoplasm of skin: Secondary | ICD-10-CM | POA: Diagnosis not present

## 2011-09-04 ENCOUNTER — Ambulatory Visit (INDEPENDENT_AMBULATORY_CARE_PROVIDER_SITE_OTHER): Payer: Medicare Other | Admitting: Family Medicine

## 2011-09-04 ENCOUNTER — Encounter: Payer: Self-pay | Admitting: Family Medicine

## 2011-09-04 VITALS — BP 124/68 | HR 68 | Temp 98.6°F | Wt 181.0 lb

## 2011-09-04 DIAGNOSIS — Z0000 Encounter for general adult medical examination without abnormal findings: Secondary | ICD-10-CM | POA: Diagnosis not present

## 2011-09-04 DIAGNOSIS — E119 Type 2 diabetes mellitus without complications: Secondary | ICD-10-CM | POA: Diagnosis not present

## 2011-09-04 DIAGNOSIS — Z23 Encounter for immunization: Secondary | ICD-10-CM | POA: Diagnosis not present

## 2011-09-04 DIAGNOSIS — E785 Hyperlipidemia, unspecified: Secondary | ICD-10-CM | POA: Diagnosis not present

## 2011-09-04 DIAGNOSIS — Z2911 Encounter for prophylactic immunotherapy for respiratory syncytial virus (RSV): Secondary | ICD-10-CM | POA: Diagnosis not present

## 2011-09-04 LAB — COMPREHENSIVE METABOLIC PANEL
Albumin: 4.6 g/dL (ref 3.5–5.2)
Alkaline Phosphatase: 47 U/L (ref 39–117)
BUN: 10 mg/dL (ref 6–23)
Calcium: 9.2 mg/dL (ref 8.4–10.5)
Chloride: 101 mEq/L (ref 96–112)
Creatinine, Ser: 0.4 mg/dL (ref 0.4–1.5)
Glucose, Bld: 122 mg/dL — ABNORMAL HIGH (ref 70–99)
Potassium: 4.2 mEq/L (ref 3.5–5.1)

## 2011-09-04 LAB — HEMOGLOBIN A1C: Hgb A1c MFr Bld: 6.1 % (ref 4.6–6.5)

## 2011-09-04 LAB — LIPID PANEL
Cholesterol: 168 mg/dL (ref 0–200)
Triglycerides: 58 mg/dL (ref 0.0–149.0)
VLDL: 11.6 mg/dL (ref 0.0–40.0)

## 2011-09-04 LAB — MICROALBUMIN / CREATININE URINE RATIO
Creatinine,U: 86.6 mg/dL
Microalb, Ur: 0.5 mg/dL (ref 0.0–1.9)

## 2011-09-04 MED ORDER — PRAVASTATIN SODIUM 40 MG PO TABS: 40.0000 mg | ORAL_TABLET | Freq: Every day | ORAL | Status: DC

## 2011-09-04 NOTE — Progress Notes (Signed)
I have personally reviewed the Medicare Annual Wellness questionnaire and have noted 1. The patient's medical and social history 2. Their use of alcohol, tobacco or illicit drugs 3. Their current medications and supplements 4. The patient's functional ability including ADL's, fall risks, home safety risks and hearing or visual             impairment. 5. Diet and physical activities 6. Evidence for depression or mood disorders  The patients weight, height, BMI have been recorded in the chart and visual acuity is per eye clinic.  I have made referrals, counseling and provided education to the patient based review of the above and I have provided the pt with a written personalized care plan for preventive services.  See scanned forms.  Routine anticipatory guidance given to patient.  See health maintenance. Tetanus can be done at f/u Flu shot encouraged Shingles 2013 PNA can be done at f/u Colonoscopy 2014 Prostate cancer screening and PSA options (with potential risks and benefits of testing vs not testing) were discussed along with recent recs/guidelines.  He deferred testing PSA at this point.   Consider PSA in the future.   Advance directive discussed.  Has a living will.   H/o AODM but no meds.  Not checking sugar.  Has been exercising but has [place] beer at the beach.  Due for labs.  BP not elevated and hasn't been started on ACE yet.   Elevated Cholesterol: Using medications without problems:yes Muscle aches: no Diet compliance:yes, except as above.  Exercise:yes  PMH and SH reviewed  Meds, vitals, and allergies reviewed.   ROS: See HPI.  Otherwise negative.    GEN: nad, alert and oriented HEENT: mucous membranes moist NECK: supple w/o LA CV: rrr. PULM: ctab, no inc wob ABD: soft, +bs EXT: no edema SKIN: no acute rash  Diabetic foot exam: Normal inspection No skin breakdown No calluses  Normal DP pulses Normal sensation to light touch and  monofilament Nails normal

## 2011-09-04 NOTE — Patient Instructions (Signed)
Go to the lab on the way out.  We'll contact you with your lab report. Take care.  Keep exercising and but back on the beer.   Glad to see you.

## 2011-09-04 NOTE — Assessment & Plan Note (Signed)
D/w pt about weight.  Check labs, see notes when resulted.  Continue current meds.

## 2011-09-04 NOTE — Assessment & Plan Note (Signed)
D/w pt about weight/diet/exercise.  Check labs, see notes when resulted.  No meds currently.

## 2011-09-04 NOTE — Assessment & Plan Note (Signed)
Routine anticipatory guidance given to patient.  See health maintenance. Tetanus can be done at f/u Flu shot encouraged Shingles 2013 PNA can be done at f/u Colonoscopy 2014 Prostate cancer screening and PSA options (with potential risks and benefits of testing vs not testing) were discussed along with recent recs/guidelines.  He deferred testing PSA at this point.   Consider PSA in the future.   Advance directive discussed.  Has a living will.

## 2011-12-14 DIAGNOSIS — R079 Chest pain, unspecified: Secondary | ICD-10-CM | POA: Diagnosis not present

## 2011-12-14 DIAGNOSIS — E785 Hyperlipidemia, unspecified: Secondary | ICD-10-CM | POA: Diagnosis not present

## 2011-12-14 DIAGNOSIS — I219 Acute myocardial infarction, unspecified: Secondary | ICD-10-CM | POA: Diagnosis not present

## 2011-12-14 DIAGNOSIS — Z7982 Long term (current) use of aspirin: Secondary | ICD-10-CM | POA: Diagnosis not present

## 2011-12-15 DIAGNOSIS — E785 Hyperlipidemia, unspecified: Secondary | ICD-10-CM | POA: Diagnosis not present

## 2011-12-15 DIAGNOSIS — R079 Chest pain, unspecified: Secondary | ICD-10-CM | POA: Diagnosis not present

## 2011-12-18 ENCOUNTER — Telehealth: Payer: Self-pay

## 2011-12-18 NOTE — Telephone Encounter (Signed)
Pt request call back today re: paperwork on Dr Lianne Bushy desk; Please advise.

## 2011-12-18 NOTE — Telephone Encounter (Signed)
Call placed.  See scanned forms.  Explained to pt that he had DM2 for years and he should follow prev instructions.

## 2012-02-11 DIAGNOSIS — I781 Nevus, non-neoplastic: Secondary | ICD-10-CM | POA: Diagnosis not present

## 2012-02-11 DIAGNOSIS — D235 Other benign neoplasm of skin of trunk: Secondary | ICD-10-CM | POA: Diagnosis not present

## 2012-02-11 DIAGNOSIS — Z85820 Personal history of malignant melanoma of skin: Secondary | ICD-10-CM | POA: Diagnosis not present

## 2012-02-27 ENCOUNTER — Encounter: Payer: Self-pay | Admitting: Gastroenterology

## 2012-03-05 ENCOUNTER — Encounter: Payer: Self-pay | Admitting: Gastroenterology

## 2012-04-14 ENCOUNTER — Encounter: Payer: Medicare Other | Admitting: Gastroenterology

## 2012-04-21 ENCOUNTER — Ambulatory Visit (AMBULATORY_SURGERY_CENTER): Payer: Medicare Other

## 2012-04-21 VITALS — Ht 66.0 in | Wt 183.2 lb

## 2012-04-21 DIAGNOSIS — Z1211 Encounter for screening for malignant neoplasm of colon: Secondary | ICD-10-CM

## 2012-04-21 MED ORDER — NA SULFATE-K SULFATE-MG SULF 17.5-3.13-1.6 GM/177ML PO SOLN: 1.0000 | Freq: Once | ORAL | Status: DC

## 2012-04-22 ENCOUNTER — Telehealth: Payer: Self-pay

## 2012-04-22 NOTE — Telephone Encounter (Signed)
Wife called in , Suprep not at [place] at [facility].  I called the pharmacy at # [phone] and gave it to them verbally.  Notified them that they are ordering it and it should be there tomorrow.

## 2012-04-30 ENCOUNTER — Other Ambulatory Visit: Payer: Self-pay

## 2012-04-30 MED ORDER — PRAVASTATIN SODIUM 40 MG PO TABS: 40.0000 mg | ORAL_TABLET | Freq: Every day | ORAL | Status: DC

## 2012-04-30 NOTE — Telephone Encounter (Signed)
[facility] [place] request refill pravastatin # 90 x 1 with note pt needs schedule CPX 08/2012.

## 2012-04-30 NOTE — Telephone Encounter (Signed)
pts wife called to ck on status of pravastatin refill; advised already spoke with [facility] [place].

## 2012-05-06 ENCOUNTER — Ambulatory Visit (AMBULATORY_SURGERY_CENTER): Payer: Medicare Other | Admitting: Gastroenterology

## 2012-05-06 ENCOUNTER — Encounter: Payer: Self-pay | Admitting: Gastroenterology

## 2012-05-06 VITALS — BP 121/78 | HR 64 | Temp 98.1°F | Resp 11 | Ht 66.0 in | Wt 183.0 lb

## 2012-05-06 DIAGNOSIS — Z1211 Encounter for screening for malignant neoplasm of colon: Secondary | ICD-10-CM

## 2012-05-06 DIAGNOSIS — E119 Type 2 diabetes mellitus without complications: Secondary | ICD-10-CM | POA: Diagnosis not present

## 2012-05-06 DIAGNOSIS — D126 Benign neoplasm of colon, unspecified: Secondary | ICD-10-CM | POA: Diagnosis not present

## 2012-05-06 MED ORDER — SODIUM CHLORIDE 0.9 % IV SOLN: 500.0000 mL | INTRAVENOUS | Status: DC

## 2012-05-06 NOTE — Progress Notes (Signed)
Called to room to assist during endoscopic procedure.  Patient ID and intended procedure confirmed with present staff. Received instructions for my participation in the procedure from the performing physician. ewm 

## 2012-05-06 NOTE — Op Note (Signed)
 [facility] [address] [place], [address]   COLONOSCOPY PROCEDURE REPORT  PATIENT: Roger, [name]  MR#: [id number] BIRTHDATE: [birth year] , 71  yrs. old GENDER: Male ENDOSCOPIST: Louis Meckel, MD REFERRED AO:[id number] Duncan, M.D. PROCEDURE DATE:  05/06/2012 PROCEDURE:   Colonoscopy with snare polypectomy ASA CLASS:   Class II INDICATIONS:average risk screening. MEDICATIONS: MAC sedation, administered by CRNA and propofol (Diprivan) 200mg  IV  DESCRIPTION OF PROCEDURE:   After the risks benefits and alternatives of the procedure were thoroughly explained, informed consent was obtained.  A digital rectal exam revealed external hemorrhoids.   The LB CF-[id number]  endoscope was introduced through the anus and advanced to the cecum, which was identified by both the appendix and ileocecal valve. No adverse events experienced.   The quality of the prep was Suprep excellent  The instrument was then slowly withdrawn as the colon was fully examined.      COLON FINDINGS: A sessile polyp measuring 3 mm in size was found in the distal transverse colon.  A polypectomy was performed with a cold snare.  The resection was complete and the polyp tissue was completely retrieved.   Three sessile polyps ranging between 3-55mm in size were found in the descending colon.  A polypectomy was performed with a cold snare.  The resection was complete and the polyp tissue was completely retrieved.   The colon mucosa was otherwise normal.  Retroflexed views revealed no abnormalities. The time to cecum=5 minutes 12 seconds.  Withdrawal time=13 minutes 13 seconds.  The scope was withdrawn and the procedure completed. COMPLICATIONS: There were no complications.  ENDOSCOPIC IMPRESSION: 1.   Sessile polyp measuring 3 mm in size was found in the distal transverse colon; polypectomy was performed with a cold snare 2.   Three sessile polyps ranging between 3-27mm in size were  found in the descending colon; polypectomy was performed with a cold snare 3.   The colon mucosa was otherwise normal  RECOMMENDATIONS: If the polyp(s) removed today are proven to be adenomatous (pre-cancerous) polyps, you will need a colonoscopy in 3 years. Otherwise you should continue to follow colorectal cancer screening guidelines for "routine risk" patients with a colonoscopy in 10 years.  You will receive a letter within 1-2 weeks with the results of your biopsy as well as final recommendations.  Please call my office if you have not received a letter after 3 weeks.   eSigned:  Louis Meckel, MD 05/06/2012 11:40 AM   cc:   PATIENT NAME:  [name], Roger MR#: [id number]

## 2012-05-06 NOTE — Progress Notes (Signed)
Patient did not experience any of the following events: a burn prior to discharge; a fall within the facility; wrong site/side/patient/procedure/implant event; or a hospital transfer or hospital admission upon discharge from the facility. (G8907) Patient did not have preoperative order for IV antibiotic SSI prophylaxis. (G8918)  

## 2012-05-06 NOTE — Patient Instructions (Addendum)
Discharge instructions given with verbal understanding. Handout on polyps given. Resume previous medications. YOU HAD AN ENDOSCOPIC PROCEDURE TODAY AT THE  ENDOSCOPY CENTER: Refer to the procedure report that was given to you for any specific questions about what was found during the examination.  If the procedure report does not answer your questions, please call your gastroenterologist to clarify.  If you requested that your care partner not be given the details of your procedure findings, then the procedure report has been included in a sealed envelope for you to review at your convenience later.  YOU SHOULD EXPECT: Some feelings of bloating in the abdomen. Passage of more gas than usual.  Walking can help get rid of the air that was put into your GI tract during the procedure and reduce the bloating. If you had a lower endoscopy (such as a colonoscopy or flexible sigmoidoscopy) you may notice spotting of blood in your stool or on the toilet paper. If you underwent a bowel prep for your procedure, then you may not have a normal bowel movement for a few days.  DIET: Your first meal following the procedure should be a light meal and then it is ok to progress to your normal diet.  A half-sandwich or bowl of soup is an example of a good first meal.  Heavy or fried foods are harder to digest and may make you feel nauseous or bloated.  Likewise meals heavy in dairy and vegetables can cause extra gas to form and this can also increase the bloating.  Drink plenty of fluids but you should avoid alcoholic beverages for 24 hours.  ACTIVITY: Your care partner should take you home directly after the procedure.  You should plan to take it easy, moving slowly for the rest of the day.  You can resume normal activity the day after the procedure however you should NOT DRIVE or use heavy machinery for 24 hours (because of the sedation medicines used during the test).    SYMPTOMS TO REPORT IMMEDIATELY: A  gastroenterologist can be reached at any hour.  During normal business hours, 8:30 AM to 5:00 PM Monday through Friday, call [phone].  After hours and on weekends, please call the GI answering service at [phone] who will take a message and have the physician on call contact you.   Following lower endoscopy (colonoscopy or flexible sigmoidoscopy):  Excessive amounts of blood in the stool  Significant tenderness or worsening of abdominal pains  Swelling of the abdomen that is new, acute  Fever of 100F or higher  FOLLOW UP: If any biopsies were taken you will be contacted by phone or by letter within the next 1-3 weeks.  Call your gastroenterologist if you have not heard about the biopsies in 3 weeks.  Our staff will call the home number listed on your records the next business day following your procedure to check on you and address any questions or concerns that you may have at that time regarding the information given to you following your procedure. This is a courtesy call and so if there is no answer at the home number and we have not heard from you through the emergency physician on call, we will assume that you have returned to your regular daily activities without incident.  SIGNATURES/CONFIDENTIALITY: You and/or your care partner have signed paperwork which will be entered into your electronic medical record.  These signatures attest to the fact that that the information above on your After Visit Summary has   been reviewed and is understood.  Full responsibility of the confidentiality of this discharge information lies with you and/or your care-partner. 

## 2012-05-07 ENCOUNTER — Telehealth: Payer: Self-pay

## 2012-05-07 NOTE — Telephone Encounter (Signed)
  Follow up Call-  Call back number 05/06/2012  Post procedure Call Back phone  # [phone]  Permission to leave phone message Yes     Patient questions:  Do you have a fever, pain , or abdominal swelling? no Pain Score  0 *  Have you tolerated food without any problems? yes  Have you been able to return to your normal activities? yes  Do you have any questions about your discharge instructions: Diet   no Medications  no Follow up visit  no  Do you have questions or concerns about your Care? no  Actions: * If pain score is 4 or above: No action needed, pain <4.

## 2012-05-07 NOTE — Telephone Encounter (Signed)
I can only change him to prediabetes due to his last A1c and sugar reading here in the clinic. This was updated.  I saw the colonoscopy report.  Thanks.

## 2012-05-07 NOTE — Telephone Encounter (Signed)
Pt said applying for life insurance and because diabetes listed on pts chart his premium is extremely expensive. Pt said is on no medication for diabetes and does not think should be listed on pts chart. Explained BS are higher than normal range but pt insist since on no medication needs to be removed from is chart.Please advise. For Dr Lianne Bushy information; Pt had colonoscopy done 05/06/12 and report was good.Pt request call back.

## 2012-05-12 NOTE — Telephone Encounter (Signed)
Patient advised.  He says that's fine.  He does need the letter to give to his insurance company indicating that he is not diabetic.  See fax number listed below.

## 2012-05-12 NOTE — Telephone Encounter (Addendum)
Pt left message with Weldon Picking. That he needs proof not a diabetic for insurance purposes. Pt prefers letter faxed to [phone]; if unable to fax Pt wants letter mailed to him at [address] [address].Please advise. Pt request call back today.

## 2012-05-12 NOTE — Telephone Encounter (Signed)
Please print and send a letter stating he doesn't currently have DM2.  Thanks.

## 2012-05-13 ENCOUNTER — Encounter: Payer: Self-pay | Admitting: *Deleted

## 2012-05-13 ENCOUNTER — Encounter: Payer: Self-pay | Admitting: Gastroenterology

## 2012-05-13 NOTE — Telephone Encounter (Signed)
Letter written, signed and faxed.

## 2012-08-11 DIAGNOSIS — D485 Neoplasm of uncertain behavior of skin: Secondary | ICD-10-CM | POA: Diagnosis not present

## 2012-08-11 DIAGNOSIS — D235 Other benign neoplasm of skin of trunk: Secondary | ICD-10-CM | POA: Diagnosis not present

## 2012-08-11 DIAGNOSIS — Z85820 Personal history of malignant melanoma of skin: Secondary | ICD-10-CM | POA: Diagnosis not present

## 2012-09-15 ENCOUNTER — Encounter: Payer: Self-pay | Admitting: Family Medicine

## 2012-09-15 ENCOUNTER — Ambulatory Visit (INDEPENDENT_AMBULATORY_CARE_PROVIDER_SITE_OTHER): Payer: Medicare Other | Admitting: Family Medicine

## 2012-09-15 VITALS — BP 122/72 | HR 73 | Temp 98.2°F | Ht 66.0 in | Wt 185.0 lb

## 2012-09-15 DIAGNOSIS — Z0000 Encounter for general adult medical examination without abnormal findings: Secondary | ICD-10-CM | POA: Diagnosis not present

## 2012-09-15 DIAGNOSIS — E7800 Pure hypercholesterolemia, unspecified: Secondary | ICD-10-CM | POA: Diagnosis not present

## 2012-09-15 DIAGNOSIS — Z23 Encounter for immunization: Secondary | ICD-10-CM | POA: Diagnosis not present

## 2012-09-15 DIAGNOSIS — E785 Hyperlipidemia, unspecified: Secondary | ICD-10-CM | POA: Diagnosis not present

## 2012-09-15 LAB — COMPREHENSIVE METABOLIC PANEL
AST: 20 U/L (ref 0–37)
Albumin: 4.2 g/dL (ref 3.5–5.2)
BUN: 13 mg/dL (ref 6–23)
CO2: 28 mEq/L (ref 19–32)
Calcium: 9.1 mg/dL (ref 8.4–10.5)
Chloride: 103 mEq/L (ref 96–112)
Creatinine, Ser: 0.6 mg/dL (ref 0.4–1.5)
GFR: 144.23 mL/min (ref >60.00)
Glucose, Bld: 148 mg/dL — ABNORMAL HIGH (ref 70–99)

## 2012-09-15 LAB — LIPID PANEL
Cholesterol: 156 mg/dL (ref 0–200)
HDL: 51.4 mg/dL (ref >39.00)
Triglycerides: 49 mg/dL (ref 0.0–149.0)

## 2012-09-15 NOTE — Progress Notes (Signed)
I have personally reviewed the Medicare Annual Wellness questionnaire and have noted 1. The patient's medical and social history 2. Their use of alcohol, tobacco or illicit drugs 3. Their current medications and supplements 4. The patient's functional ability including ADL's, fall risks, home safety risks and hearing or visual             impairment. 5. Diet and physical activities 6. Evidence for depression or mood disorders  The patients weight, height, BMI have been recorded in the chart and visual acuity is per eye clinic.  I have made referrals, counseling and provided education to the patient based review of the above and I have provided the pt with a written personalized care plan for preventive services.  See scanned forms (he'll main in the medicare wellness form- he forgot it.  We covered the relevant details at the visit).  Routine anticipatory guidance given to patient.  See health maintenance. Flu encouraged Shingles 2013 PNA 2014 Tetanus d/w pt.   Colon  2014 Prostate cancer screening and PSA options (with potential risks and benefits of testing vs not testing) were discussed along with recent recs/guidelines.  He declined testing PSA at this point. Advance directive- living will done by patient. Wife would be designated if incapacitated.  Cognitive function addressed- see scanned forms- and if abnormal then additional documentation follows.   Seeing Dr. Terri Piedra for derm.   Elevated Cholesterol: Using medications without problems:yes Muscle aches: no Diet compliance: discussed Exercise: yes, walking  PMH and SH reviewed  Meds, vitals, and allergies reviewed.   ROS: See HPI.  Otherwise negative.    GEN: nad, alert and oriented, recall and mentation wnl.  HEENT: mucous membranes moist NECK: supple w/o LA CV: rrr. PULM: ctab, no inc wob ABD: soft, +bs EXT: no edema SKIN: no acute rash

## 2012-09-15 NOTE — Assessment & Plan Note (Signed)
See scanned forms.  Routine anticipatory guidance given to patient.  See health maintenance. Flu encouraged Shingles 2013 PNA 2014 Tetanus d/w pt, encouraged Colon  2014 Prostate cancer screening and PSA options (with potential risks and benefits of testing vs not testing) were discussed along with recent recs/guidelines.  He declined testing PSA at this point. Advance directive- living will done by patient. Wife would be designated if incapacitated.  Cognitive function addressed- see scanned forms- and if abnormal then additional documentation follows.

## 2012-09-15 NOTE — Assessment & Plan Note (Signed)
Tolerating statin, no ADE, see notes on labs when resulted.

## 2012-09-15 NOTE — Addendum Note (Signed)
Addended by: Annamarie Major on: 09/15/2012 10:32 AM   Modules accepted: Orders

## 2012-09-15 NOTE — Patient Instructions (Addendum)
I would get a flu shot each fall.   Check on getting a tetanus shot (Tdap) at the health department or pharmacy.  Check with your insurance.   Mail me your forms.   We'll contact you with your lab report. Take care.

## 2012-09-16 ENCOUNTER — Encounter: Payer: Self-pay | Admitting: *Deleted

## 2012-10-02 DIAGNOSIS — H259 Unspecified age-related cataract: Secondary | ICD-10-CM | POA: Diagnosis not present

## 2013-03-02 ENCOUNTER — Other Ambulatory Visit: Payer: Self-pay | Admitting: Dermatology

## 2013-03-02 DIAGNOSIS — B079 Viral wart, unspecified: Secondary | ICD-10-CM | POA: Diagnosis not present

## 2013-03-02 DIAGNOSIS — L821 Other seborrheic keratosis: Secondary | ICD-10-CM | POA: Diagnosis not present

## 2013-03-02 DIAGNOSIS — D235 Other benign neoplasm of skin of trunk: Secondary | ICD-10-CM | POA: Diagnosis not present

## 2013-03-02 DIAGNOSIS — Z85820 Personal history of malignant melanoma of skin: Secondary | ICD-10-CM | POA: Diagnosis not present

## 2013-03-02 DIAGNOSIS — L819 Disorder of pigmentation, unspecified: Secondary | ICD-10-CM | POA: Diagnosis not present

## 2013-03-02 DIAGNOSIS — L820 Inflamed seborrheic keratosis: Secondary | ICD-10-CM | POA: Diagnosis not present

## 2013-03-27 ENCOUNTER — Other Ambulatory Visit: Payer: Self-pay | Admitting: Family Medicine

## 2013-04-29 DIAGNOSIS — L819 Disorder of pigmentation, unspecified: Secondary | ICD-10-CM | POA: Diagnosis not present

## 2013-04-29 DIAGNOSIS — L570 Actinic keratosis: Secondary | ICD-10-CM | POA: Diagnosis not present

## 2013-04-29 DIAGNOSIS — L821 Other seborrheic keratosis: Secondary | ICD-10-CM | POA: Diagnosis not present

## 2013-04-29 DIAGNOSIS — D235 Other benign neoplasm of skin of trunk: Secondary | ICD-10-CM | POA: Diagnosis not present

## 2013-07-12 DIAGNOSIS — J060 Acute laryngopharyngitis: Secondary | ICD-10-CM | POA: Diagnosis not present

## 2013-07-12 DIAGNOSIS — R059 Cough, unspecified: Secondary | ICD-10-CM | POA: Diagnosis not present

## 2013-07-12 DIAGNOSIS — R05 Cough: Secondary | ICD-10-CM | POA: Diagnosis not present

## 2013-07-17 DIAGNOSIS — H2510 Age-related nuclear cataract, unspecified eye: Secondary | ICD-10-CM | POA: Diagnosis not present

## 2013-07-28 DIAGNOSIS — H903 Sensorineural hearing loss, bilateral: Secondary | ICD-10-CM | POA: Diagnosis not present

## 2013-08-04 DIAGNOSIS — D235 Other benign neoplasm of skin of trunk: Secondary | ICD-10-CM | POA: Diagnosis not present

## 2013-08-04 DIAGNOSIS — Z85820 Personal history of malignant melanoma of skin: Secondary | ICD-10-CM | POA: Diagnosis not present

## 2013-08-04 DIAGNOSIS — L821 Other seborrheic keratosis: Secondary | ICD-10-CM | POA: Diagnosis not present

## 2013-08-04 DIAGNOSIS — L570 Actinic keratosis: Secondary | ICD-10-CM | POA: Diagnosis not present

## 2013-09-24 ENCOUNTER — Encounter: Payer: Medicare Other | Admitting: Family Medicine

## 2013-10-06 ENCOUNTER — Ambulatory Visit (INDEPENDENT_AMBULATORY_CARE_PROVIDER_SITE_OTHER): Payer: Medicare Other | Admitting: Family Medicine

## 2013-10-06 ENCOUNTER — Encounter: Payer: Self-pay | Admitting: Family Medicine

## 2013-10-06 VITALS — BP 118/72 | HR 69 | Temp 98.0°F | Wt 181.0 lb

## 2013-10-06 DIAGNOSIS — R7303 Prediabetes: Secondary | ICD-10-CM

## 2013-10-06 DIAGNOSIS — E7800 Pure hypercholesterolemia, unspecified: Secondary | ICD-10-CM | POA: Diagnosis not present

## 2013-10-06 DIAGNOSIS — E785 Hyperlipidemia, unspecified: Secondary | ICD-10-CM

## 2013-10-06 DIAGNOSIS — R7309 Other abnormal glucose: Secondary | ICD-10-CM

## 2013-10-06 DIAGNOSIS — Z23 Encounter for immunization: Secondary | ICD-10-CM | POA: Diagnosis not present

## 2013-10-06 DIAGNOSIS — Z0000 Encounter for general adult medical examination without abnormal findings: Secondary | ICD-10-CM

## 2013-10-06 LAB — LIPID PANEL
CHOL/HDL RATIO: 4
Cholesterol: 121 mg/dL (ref 0–200)
HDL: 30.2 mg/dL — AB (ref >39.00)
LDL Cholesterol: 76 mg/dL (ref 0–99)
NonHDL: 90.8
TRIGLYCERIDES: 72 mg/dL (ref 0.0–149.0)
VLDL: 14.4 mg/dL (ref 0.0–40.0)

## 2013-10-06 LAB — COMPREHENSIVE METABOLIC PANEL
ALT: 22 U/L (ref 0–53)
AST: 19 U/L (ref 0–37)
Albumin: 4.2 g/dL (ref 3.5–5.2)
Alkaline Phosphatase: 49 U/L (ref 39–117)
BUN: 10 mg/dL (ref 6–23)
CALCIUM: 9.3 mg/dL (ref 8.4–10.5)
CHLORIDE: 101 meq/L (ref 96–112)
CO2: 29 mEq/L (ref 19–32)
CREATININE: 0.6 mg/dL (ref 0.4–1.5)
GFR: 149.63 mL/min (ref >60.00)
Glucose, Bld: 133 mg/dL — ABNORMAL HIGH (ref 70–99)
Potassium: 4.4 mEq/L (ref 3.5–5.1)
Sodium: 138 mEq/L (ref 135–145)
Total Bilirubin: 0.8 mg/dL (ref 0.2–1.2)
Total Protein: 7.4 g/dL (ref 6.0–8.3)

## 2013-10-06 NOTE — Progress Notes (Signed)
I have personally reviewed the Medicare Annual Wellness questionnaire and have noted 1. The patient's medical and social history 2. Their use of alcohol, tobacco or illicit drugs 3. Their current medications and supplements 4. The patient's functional ability including ADL's, fall risks, home safety risks and hearing or visual             impairment. 5. Diet and physical activities 6. Evidence for depression or mood disorders  The patients weight, height, BMI have been recorded in the chart and visual acuity is per eye clinic.  I have made referrals, counseling and provided education to the patient based review of the above and I have provided the pt with a written personalized care plan for preventive services.  Provider list updated- see scanned forms.  Routine anticipatory guidance given to patient.  See health maintenance.  Flu shot updated 2015 Shingles 2013  PNA 2014  Tetanus d/w pt.  Colon 2014  Prostate cancer screening and PSA options (with potential risks and benefits of testing vs not testing) were discussed along with recent recs/guidelines. He declined testing PSA at this point.  Advance directive- living will done by patient. Wife would be designated if incapacitated.  Cognitive function addressed- see scanned forms- and if abnormal then additional documentation follows.   Elevated Cholesterol: Using medications without problems:yes Muscle aches: no Diet compliance: yes, good diet per patient.  Low carb diet.   Exercise:yes Lost weight in the last year, intentional weight loss.  See notes on labs.    PMH and SH reviewed  Meds, vitals, and allergies reviewed.   ROS: See HPI.  Otherwise negative.    GEN: nad, alert and oriented HEENT: mucous membranes moist NECK: supple w/o LA CV: rrr. PULM: ctab, no inc wob ABD: soft, +bs EXT: no edema SKIN: no acute rash

## 2013-10-06 NOTE — Patient Instructions (Addendum)
I would check on getting the tetanus shot that has the extra whooping cough protection.  It may be cheaper at the pharmacy.  Go to the lab on the way out.  We'll contact you with your lab report. Take care.  Glad to see you.  Recheck in about 1 year.

## 2013-10-08 ENCOUNTER — Telehealth: Payer: Self-pay | Admitting: Family Medicine

## 2013-10-08 MED ORDER — PRAVASTATIN SODIUM 20 MG PO TABS: 20.0000 mg | ORAL_TABLET | Freq: Every day | ORAL | Status: DC

## 2013-10-08 NOTE — Telephone Encounter (Signed)
I tried to give patient lab results, but he had several questions about his prescriptions.  Please call patient.

## 2013-10-08 NOTE — Assessment & Plan Note (Signed)
D/w pt about diet and exercise, sugar, and weight loss.  He understood.

## 2013-10-08 NOTE — Telephone Encounter (Signed)
Called patient back and answered his questions.

## 2013-10-08 NOTE — Assessment & Plan Note (Signed)
D/w pt re: diet and exercise.  See notes on labs.

## 2013-10-08 NOTE — Assessment & Plan Note (Signed)
Flu shot updated 2015 Shingles 2013  PNA 2014  Tetanus d/w pt.  Colon 2014  Prostate cancer screening and PSA options (with potential risks and benefits of testing vs not testing) were discussed along with recent recs/guidelines. He declined testing PSA at this point.  Advance directive- living will done by patient. Wife would be designated if incapacitated.  Cognitive function addressed- see scanned forms- and if abnormal then additional documentation follows.

## 2014-01-12 DIAGNOSIS — Z85828 Personal history of other malignant neoplasm of skin: Secondary | ICD-10-CM | POA: Diagnosis not present

## 2014-01-12 DIAGNOSIS — Z85820 Personal history of malignant melanoma of skin: Secondary | ICD-10-CM | POA: Diagnosis not present

## 2014-01-12 DIAGNOSIS — Z08 Encounter for follow-up examination after completed treatment for malignant neoplasm: Secondary | ICD-10-CM | POA: Diagnosis not present

## 2014-01-12 DIAGNOSIS — L814 Other melanin hyperpigmentation: Secondary | ICD-10-CM | POA: Diagnosis not present

## 2014-01-17 DIAGNOSIS — J00 Acute nasopharyngitis [common cold]: Secondary | ICD-10-CM | POA: Diagnosis not present

## 2014-04-14 DIAGNOSIS — H2513 Age-related nuclear cataract, bilateral: Secondary | ICD-10-CM | POA: Diagnosis not present

## 2014-05-06 ENCOUNTER — Telehealth: Payer: Self-pay | Admitting: Family Medicine

## 2014-05-06 DIAGNOSIS — R0683 Snoring: Secondary | ICD-10-CM

## 2014-05-06 NOTE — Telephone Encounter (Signed)
Pt came in today wanting to get a  Referral to sleep apnea dr. He would like to go to the same dr as his nephew. Dr Damita Dunnings is his dr also.  His nephew is name is Max Rosana Berger He wasn't for sure of the dr name

## 2014-05-06 NOTE — Telephone Encounter (Signed)
I'll refer to Dr. Halford Chessman but I need a sx or dx to which I can link it.  Please get some info and let me know.  Thanks.

## 2014-05-06 NOTE — Telephone Encounter (Signed)
We can send him for OSA eval if he is snoring.  Thanks. Ordered.

## 2014-05-06 NOTE — Telephone Encounter (Signed)
Patient says that some lady at the hospital told him that the CPAP machines are a lot different now than they used to be and that he might benefit from one because of his snoring.  Patient says he was led to believe that it was very similar to oxygen nasal cannulas.  Patient says he does snore at night but does not feel like his sleep is interrupted.  Patient says he doesn't really have any other symptoms.  I questioned him if he ever awoke from sleep, gasping for breath and he says "No" that never happens.  Patient says that he and his wife would like to see pulmonay about this because she snores too.  (She apparently sees another MD).  I did explain to the patient that evaluation for this usually involves a sleep study and while there have been significant improvements in the CPAP machine, the apparatus does still cover your nose and/or mouth, depending on the need.

## 2014-05-23 LAB — HM DIABETES EYE EXAM

## 2014-06-23 ENCOUNTER — Institutional Professional Consult (permissible substitution): Payer: BLUE CROSS/BLUE SHIELD | Admitting: Pulmonary Disease

## 2014-07-20 DIAGNOSIS — D225 Melanocytic nevi of trunk: Secondary | ICD-10-CM | POA: Diagnosis not present

## 2014-07-20 DIAGNOSIS — Z08 Encounter for follow-up examination after completed treatment for malignant neoplasm: Secondary | ICD-10-CM | POA: Diagnosis not present

## 2014-07-20 DIAGNOSIS — Z85820 Personal history of malignant melanoma of skin: Secondary | ICD-10-CM | POA: Diagnosis not present

## 2014-07-20 DIAGNOSIS — L814 Other melanin hyperpigmentation: Secondary | ICD-10-CM | POA: Diagnosis not present

## 2014-08-12 ENCOUNTER — Ambulatory Visit (INDEPENDENT_AMBULATORY_CARE_PROVIDER_SITE_OTHER): Payer: Medicare Other | Admitting: Family Medicine

## 2014-08-12 VITALS — BP 128/82 | HR 81 | Temp 98.1°F | Resp 18 | Ht 66.0 in | Wt 181.2 lb

## 2014-08-12 DIAGNOSIS — J209 Acute bronchitis, unspecified: Secondary | ICD-10-CM

## 2014-08-12 MED ORDER — AZITHROMYCIN 250 MG PO TABS: ORAL_TABLET | ORAL | Status: DC

## 2014-08-12 NOTE — Patient Instructions (Signed)
Drink plenty of fluids and get enough rest  If needed take some Mucinex DM if you develop more of a cough  Take azithromycin 2 pills initially, then 1 daily for 4 days  Return as needed

## 2014-08-12 NOTE — Progress Notes (Signed)
  Subjective:  Patient ID: Roger [name], male    DOB: 06-20-1940  Age: 74 y.o. MRN: [id number]  See lives some of the time in [place], mostly done at [facility] now. 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 He is up here for a few days. He's had 5-7 days of coughing up green phlegm. He blows a little out of his nose. He's really not ill. No fever or chills or headache or ear pain or sore throat. He just keep bringing up green stuff and thinks is time to be on something for that. He was treated here about 4 years ago for that and did well.   Objective:   Pleasant healthy-appearing suntanned man in no acute distress. Wears bilateral hearing aids. TMs are normal with no erythema. Throat clear. Neck supple without significant nodes. Chest is clear to auscultation. Heart regular without murmurs.  Assessment & Plan:   Assessment: Mild acute bronchitis  Plan: There are no Patient Instructions on file for this visit.   Aimee Heldman, MD 08/12/2014

## 2014-08-23 ENCOUNTER — Ambulatory Visit (INDEPENDENT_AMBULATORY_CARE_PROVIDER_SITE_OTHER): Payer: Medicare Other | Admitting: Family Medicine

## 2014-08-23 ENCOUNTER — Encounter: Payer: Self-pay | Admitting: Family Medicine

## 2014-08-23 VITALS — BP 112/64 | HR 74 | Temp 98.5°F | Wt 178.5 lb

## 2014-08-23 DIAGNOSIS — R739 Hyperglycemia, unspecified: Secondary | ICD-10-CM

## 2014-08-23 DIAGNOSIS — R7309 Other abnormal glucose: Secondary | ICD-10-CM

## 2014-08-23 LAB — HEMOGLOBIN A1C: Hgb A1c MFr Bld: 6.7 % — ABNORMAL HIGH (ref 4.6–6.5)

## 2014-08-23 LAB — POCT CBG (FASTING - GLUCOSE)-MANUAL ENTRY: GLUCOSE FASTING, POC: 90 mg/dL (ref 70–99)

## 2014-08-23 NOTE — Assessment & Plan Note (Signed)
D/w pt about diet and exercise, recheck A1c today.  Caring for his wife, lack of exercise, diet disruption may have contributed.  D/w pt.  Okay for outpatient f/u.

## 2014-08-23 NOTE — Progress Notes (Signed)
Pre visit review using our clinic review tool, if applicable. No additional management support is needed unless otherwise documented below in the visit note.  His wife had her knee replaced earlier this year.  She has myasthenia gravis, dx'd when she had a crisis.  She is out of the hospital now.    His sugar was up on some home checks.  Fasting sugar here at the clinic was 90.  Nonfasting was up to 170s right after eating.  D/w pt about recheck A1c today.   Fasting sugar was 145 this AM.    Meds, vitals, and allergies reviewed.   ROS: See HPI.  Otherwise, noncontributory.  GEN: nad, alert and oriented HEENT: mucous membranes moist NECK: supple w/o LA CV: rrr. PULM: ctab, no inc wob EXT w/o edema

## 2014-08-23 NOTE — Patient Instructions (Signed)
Low carb diet, plenty of water, and exercise.  Go to the lab on the way out.  We'll contact you with your lab report. Take care.  Glad to see you.

## 2014-10-04 ENCOUNTER — Telehealth: Payer: Self-pay | Admitting: Family Medicine

## 2014-10-04 NOTE — Telephone Encounter (Signed)
Noted  

## 2014-10-04 NOTE — Telephone Encounter (Signed)
[facility] Patient Name: Roger [name] Gender: Male DOB: 1940-08-24 Age: 74 Y 3 M 25 D Return Phone Number: [phone] (Primary) Address: City/State/Zip: [place] [facility] [place] [facility] - Client Client Site [facility] - Day Physician Renford Dills Contact Type Call Call Type Triage / Clinical Relationship To Patient Self Appointment Disposition EMR Appointment Not Necessary Info pasted into Epic Yes Return Phone Number [phone] (Primary) Chief Complaint Blood Sugar Low Initial Comment Caller states his BS is 165. Nurse Assessment Guidelines Guideline Title Affirmed Question Affirmed Notes Nurse Date/Time (Eastern Time) Disp. Time Eilene Ghazi Time) Disposition Final User 10/04/2014 1:35:51 PM Clinical Call Yes Mechele Dawley, RN, Amy After Care Instructions Given Call Event Type User Date / Time Description Comments User: Susanne Borders, RN Date/Time Eilene Ghazi Time): 10/04/2014 1:35:35 PM CALLER STATES THAT HE IS NOT A DIABETIC. [place] AND HE CHECKED HIS BS LEVEL WITH HIS WIFE'S MACHINE AND IT WAS 165 THIS MORNING. HE STATES THAT HE WANTED TO KNOW WHAT HE COULD EAT TO GET IT DOWN. HE STATES THAT HE ATE SOME WHITE CHICKEN WINGS AND CELERY ONLY TODAY AND IS DRINKING SOME WATER. WENT OVER SOME OF THE FOODS THAT HE CAN EAT THAT ARE THE GOOD CARBS VS BAD CARBS. HE STATES THAT IF HIS BS LEVEL IS AT 165 THAT MEANS HE IS A DIABETIC. INFORMED HIM THAT HE WOULD NEED TO TALK WITH DR. Damita Dunnings ABOUT THAT AND SEE IF HE COULD HAVE A HBA1C TEST PERFORMED AND SEE WHAT HIS LEVELS HAVE BEEN TO DETERMINE THAT AND DR. DUNCAN WOULD GO OVER THAT WITH HIM. HE STATES THAT HE WILL FOLLOW UP WITH DR. Damita Dunnings TO SEE IF HE IS. HE UNDERSTOOD THE DIET INFORMATION NURSE PROVIDED FOR HIM. HE STATES HE WILL FOLLOW UP. WILL CLOSE THIS CLINICAL CALL.

## 2014-10-12 ENCOUNTER — Encounter: Payer: BLUE CROSS/BLUE SHIELD | Admitting: Family Medicine

## 2014-11-15 ENCOUNTER — Other Ambulatory Visit: Payer: Self-pay | Admitting: Dermatology

## 2014-11-15 ENCOUNTER — Ambulatory Visit
Admission: RE | Admit: 2014-11-15 | Discharge: 2014-11-15 | Disposition: A | Discharge status: Home / Self Care | Payer: Medicare Other | Source: Ambulatory Visit | Attending: Dermatology | Admitting: Dermatology

## 2014-11-15 DIAGNOSIS — Z85820 Personal history of malignant melanoma of skin: Secondary | ICD-10-CM

## 2014-12-13 ENCOUNTER — Encounter: Payer: Self-pay | Admitting: Family Medicine

## 2014-12-13 ENCOUNTER — Ambulatory Visit (INDEPENDENT_AMBULATORY_CARE_PROVIDER_SITE_OTHER): Payer: Medicare Other | Admitting: Family Medicine

## 2014-12-13 VITALS — BP 122/68 | HR 74 | Temp 98.8°F | Ht 65.5 in | Wt 182.0 lb

## 2014-12-13 DIAGNOSIS — Z23 Encounter for immunization: Secondary | ICD-10-CM

## 2014-12-13 DIAGNOSIS — Z7189 Other specified counseling: Secondary | ICD-10-CM | POA: Insufficient documentation

## 2014-12-13 DIAGNOSIS — E785 Hyperlipidemia, unspecified: Secondary | ICD-10-CM

## 2014-12-13 DIAGNOSIS — Z0000 Encounter for general adult medical examination without abnormal findings: Secondary | ICD-10-CM | POA: Diagnosis not present

## 2014-12-13 DIAGNOSIS — E119 Type 2 diabetes mellitus without complications: Secondary | ICD-10-CM | POA: Diagnosis not present

## 2014-12-13 LAB — COMPREHENSIVE METABOLIC PANEL
ALT: 22 U/L (ref 0–53)
AST: 18 U/L (ref 0–37)
Albumin: 4.4 g/dL (ref 3.5–5.2)
Alkaline Phosphatase: 54 U/L (ref 39–117)
BUN: 11 mg/dL (ref 6–23)
CHLORIDE: 101 meq/L (ref 96–112)
CO2: 30 mEq/L (ref 19–32)
CREATININE: 0.61 mg/dL (ref 0.40–1.50)
Calcium: 9.5 mg/dL (ref 8.4–10.5)
GFR: 137.9 mL/min (ref >60.00)
GLUCOSE: 154 mg/dL — AB (ref 70–99)
POTASSIUM: 4.7 meq/L (ref 3.5–5.1)
SODIUM: 138 meq/L (ref 135–145)
Total Bilirubin: 0.9 mg/dL (ref 0.2–1.2)
Total Protein: 7.1 g/dL (ref 6.0–8.3)

## 2014-12-13 LAB — LIPID PANEL
CHOL/HDL RATIO: 4
Cholesterol: 177 mg/dL (ref 0–200)
HDL: 50.3 mg/dL (ref >39.00)
LDL CALC: 113 mg/dL — AB (ref 0–99)
NONHDL: 126.57
Triglycerides: 70 mg/dL (ref 0.0–149.0)
VLDL: 14 mg/dL (ref 0.0–40.0)

## 2014-12-13 LAB — HEMOGLOBIN A1C: HEMOGLOBIN A1C: 7 % — AB (ref 4.6–6.5)

## 2014-12-13 LAB — MICROALBUMIN / CREATININE URINE RATIO
Creatinine,U: 177.6 mg/dL
Microalb Creat Ratio: 0.7 mg/g (ref 0.0–30.0)
Microalb, Ur: 1.3 mg/dL (ref 0.0–1.9)

## 2014-12-13 MED ORDER — PRAVASTATIN SODIUM 20 MG PO TABS: 20.0000 mg | ORAL_TABLET | Freq: Every day | ORAL | Status: DC

## 2014-12-13 NOTE — Patient Instructions (Addendum)
Check with your insurance to see if they will cover the tetanus shot.   If you don't get a call or note from GI in April of 2017, then call them for a follow up colonoscopy.  Don't change your meds for now.  Go to the lab on the way out.  We'll contact you with your lab report. Take care.  Glad to see you.

## 2014-12-13 NOTE — Assessment & Plan Note (Signed)
He may not be diabetic at this point, due for f/u labs.  See notes on labs.  D/w pt about diet and exercise.  Likely the plan will be continued diet and exercise, if A1c <6.5 or >6.5.  I wouldn't expect him to need meds at this point.  We talked about normal vs prediabetic vs diabetic dx and criteria.

## 2014-12-13 NOTE — Assessment & Plan Note (Signed)
Tolerating statin, continue as is for now.  See notes on labs.  He agrees.

## 2014-12-13 NOTE — Assessment & Plan Note (Signed)
Advance directive- living will done by patient. Wife would be designated if incapacitated.  

## 2014-12-13 NOTE — Assessment & Plan Note (Signed)
Flu shot updated 2016 Shingles 2013  PNA 2014  Tetanus d/w pt.  Colon 2014, due in 2017.  D/w pt.   Prostate cancer screening and PSA options (with potential risks and benefits of testing vs not testing) were discussed along with recent recs/guidelines.  He declined testing PSA at this point. Advance directive- living will done by patient. Wife would be designated if incapacitated.  Cognitive function addressed- see scanned forms- and if abnormal then additional documentation follows.

## 2014-12-13 NOTE — Progress Notes (Signed)
Pre visit review using our clinic review tool, if applicable. No additional management support is needed unless otherwise documented below in the visit note.  I have personally reviewed the Medicare Annual Wellness questionnaire and have noted 1. The patient's medical and social history 2. Their use of alcohol, tobacco or illicit drugs 3. Their current medications and supplements 4. The patient's functional ability including ADL's, fall risks, home safety risks and hearing or visual             impairment. 5. Diet and physical activities 6. Evidence for depression or mood disorders  The patients weight, height, BMI have been recorded in the chart and visual acuity is per eye clinic.  I have made referrals, counseling and provided education to the patient based review of the above and I have provided the pt with a written personalized care plan for preventive services.  Provider list updated- see scanned forms.  Routine anticipatory guidance given to patient.  See health maintenance.  Flu shot updated 2016 Shingles 2013  PNA 2014  Tetanus d/w pt.  Colon 2014, due in 2017.  D/w pt.   Prostate cancer screening and PSA options (with potential risks and benefits of testing vs not testing) were discussed along with recent recs/guidelines.  He declined testing PSA at this point. Advance directive- living will done by patient. Wife would be designated if incapacitated.  Cognitive function addressed- see scanned forms- and if abnormal then additional documentation follows.   H/o hyperglycemia/DM2.  Due for flu labs.  No meds currently.  D/w pt about diet and exercise.  See f/u labs.  Eye exam 05/2014.  No foot trouble, no numbness, no ulceration, etc.   Elevated Cholesterol: Using medications without problems:yes Muscle aches: no Diet compliance:yes Exercise: as much as he can, complicated by caring for his wife recently.    PMH and SH reviewed  Meds, vitals, and allergies reviewed.    ROS: See HPI.  Otherwise negative.    GEN: nad, alert and oriented HEENT: mucous membranes moist NECK: supple w/o LA CV: rrr. PULM: ctab, no inc wob ABD: soft, +bs EXT: no edema SKIN: no acute rash  Diabetic foot exam: Normal inspection No skin breakdown No calluses  Normal DP pulses Normal sensation to light touch and monofilament Nails normal

## 2014-12-15 ENCOUNTER — Other Ambulatory Visit: Payer: Self-pay | Admitting: Family Medicine

## 2014-12-15 DIAGNOSIS — E119 Type 2 diabetes mellitus without complications: Secondary | ICD-10-CM

## 2014-12-21 ENCOUNTER — Encounter: Payer: Self-pay | Admitting: *Deleted

## 2014-12-21 ENCOUNTER — Telehealth: Payer: Self-pay | Admitting: Family Medicine

## 2014-12-21 NOTE — Telephone Encounter (Signed)
Pt called checking on labs results from last week Best number [phone]

## 2014-12-21 NOTE — Telephone Encounter (Signed)
Please call patient back with lab results

## 2014-12-21 NOTE — Telephone Encounter (Signed)
Spoke with patient and mailed results to patient.  Scheduled 3 months follow up appt.

## 2015-02-28 DIAGNOSIS — Z85820 Personal history of malignant melanoma of skin: Secondary | ICD-10-CM | POA: Diagnosis not present

## 2015-02-28 DIAGNOSIS — L821 Other seborrheic keratosis: Secondary | ICD-10-CM | POA: Diagnosis not present

## 2015-02-28 DIAGNOSIS — L812 Freckles: Secondary | ICD-10-CM | POA: Diagnosis not present

## 2015-02-28 DIAGNOSIS — D1801 Hemangioma of skin and subcutaneous tissue: Secondary | ICD-10-CM | POA: Diagnosis not present

## 2015-03-23 ENCOUNTER — Encounter: Payer: Self-pay | Admitting: Gastroenterology

## 2015-03-25 ENCOUNTER — Ambulatory Visit (INDEPENDENT_AMBULATORY_CARE_PROVIDER_SITE_OTHER): Payer: Medicare Other | Admitting: Family Medicine

## 2015-03-25 ENCOUNTER — Encounter: Payer: Self-pay | Admitting: Family Medicine

## 2015-03-25 VITALS — BP 110/70 | HR 69 | Temp 98.3°F | Wt 176.5 lb

## 2015-03-25 DIAGNOSIS — E119 Type 2 diabetes mellitus without complications: Secondary | ICD-10-CM | POA: Diagnosis not present

## 2015-03-25 LAB — HEMOGLOBIN A1C: HEMOGLOBIN A1C: 6.7 % — AB (ref 4.6–6.5)

## 2015-03-25 LAB — LIPID PANEL
CHOLESTEROL: 158 mg/dL (ref 0–200)
HDL: 46.5 mg/dL (ref >39.00)
LDL Cholesterol: 99 mg/dL (ref 0–99)
NONHDL: 111.6
Total CHOL/HDL Ratio: 3
Triglycerides: 65 mg/dL (ref 0.0–149.0)
VLDL: 13 mg/dL (ref 0.0–40.0)

## 2015-03-25 NOTE — Assessment & Plan Note (Signed)
No meds.  Continue work on diet and exercise.   See notes on labs.   Path/phys d/w pt.  He has mild residual URI sx.  Should resolve.  See AVS.

## 2015-03-25 NOTE — Progress Notes (Signed)
Pre visit review using our clinic review tool, if applicable. No additional management support is needed unless otherwise documented below in the visit note.  Dm2 f/u.  Intentional weight loss with exercise and diet.  Cutting back on carbs.  Due for f/u labs.  He feels good.  Still on statin, no other meds.  D/w pt about DM2 path/phys.    He had a cold prev, some better now.  No fevers.  No sputum.  Voice is still altered.  No rhinorrhea.  He chest still feels congested.    Meds, vitals, and allergies reviewed.   ROS: See HPI.  Otherwise, noncontributory.  GEN: nad, alert and oriented, voice is slightly scratchy HEENT: mucous membranes moist, tm w/o erythema, nasal exam w/o erythema, some clear discharge noted,  OP wnl NECK: supple w/o LA CV: rrr.   PULM: totally ctab, no inc wob EXT: no edema SKIN: no acute rash

## 2015-03-25 NOTE — Patient Instructions (Addendum)
Go to the lab on the way out.  We'll contact you with your lab report. Use nasal saline and gargle with salt water in the meantime.  Your chest sounds totally clear.   Take care.  Glad to see you.  Keep working on your weight.   Thanks for your effort.

## 2015-03-25 NOTE — Addendum Note (Signed)
Addended by: Marchia Bond on: 03/25/2015 10:13 AM   Modules accepted: Orders

## 2015-03-27 ENCOUNTER — Other Ambulatory Visit: Payer: Self-pay | Admitting: Family Medicine

## 2015-03-27 DIAGNOSIS — E119 Type 2 diabetes mellitus without complications: Secondary | ICD-10-CM

## 2015-04-18 ENCOUNTER — Encounter: Payer: Self-pay | Admitting: Gastroenterology

## 2015-06-01 DIAGNOSIS — H2513 Age-related nuclear cataract, bilateral: Secondary | ICD-10-CM | POA: Diagnosis not present

## 2015-06-01 DIAGNOSIS — H353131 Nonexudative age-related macular degeneration, bilateral, early dry stage: Secondary | ICD-10-CM | POA: Diagnosis not present

## 2015-06-02 ENCOUNTER — Ambulatory Visit (AMBULATORY_SURGERY_CENTER): Payer: Self-pay

## 2015-06-02 VITALS — Ht 66.0 in | Wt 176.8 lb

## 2015-06-02 DIAGNOSIS — Z860100 Personal history of colon polyps, unspecified: Secondary | ICD-10-CM

## 2015-06-02 NOTE — Progress Notes (Signed)
No allergies to eggs or soy No past problems with anesthesia No diet meds No home oxygen  No internet 

## 2015-06-15 ENCOUNTER — Encounter: Payer: Self-pay | Admitting: Gastroenterology

## 2015-06-15 ENCOUNTER — Ambulatory Visit (AMBULATORY_SURGERY_CENTER): Payer: Medicare Other | Admitting: Gastroenterology

## 2015-06-15 VITALS — BP 156/77 | HR 75 | Temp 98.0°F | Resp 15 | Ht 66.0 in | Wt 176.0 lb

## 2015-06-15 DIAGNOSIS — E669 Obesity, unspecified: Secondary | ICD-10-CM | POA: Diagnosis not present

## 2015-06-15 DIAGNOSIS — Z86010 Personal history of colonic polyps: Secondary | ICD-10-CM

## 2015-06-15 DIAGNOSIS — D122 Benign neoplasm of ascending colon: Secondary | ICD-10-CM | POA: Diagnosis not present

## 2015-06-15 DIAGNOSIS — D124 Benign neoplasm of descending colon: Secondary | ICD-10-CM

## 2015-06-15 MED ORDER — SODIUM CHLORIDE 0.9 % IV SOLN: 500.0000 mL | INTRAVENOUS | Status: DC

## 2015-06-15 NOTE — Progress Notes (Signed)
A/ox3, pleased with MAC, report to Jane RN 

## 2015-06-15 NOTE — Op Note (Signed)
[facility] Patient Name: Roger [name] Procedure Date: 06/15/2015 11:39 AM MRN: [id number] Endoscopist: Tamms. Loletha Carrow , MD Age: 75 Referring MD:  Date of Birth: May 08, 1940 Gender: Male Procedure:                Colonoscopy Indications:              Surveillance: Personal history of adenomatous                            polyps on last colonoscopy > 3 years ago ( Four                            polyps, each < 1cm , 04/2012) Medicines:                Monitored Anesthesia Care Procedure:                Pre-Anesthesia Assessment:                           - Prior to the procedure, a History and Physical                            was performed, and patient medications and                            allergies were reviewed. The patient's tolerance of                            previous anesthesia was also reviewed. The risks                            and benefits of the procedure and the sedation                            options and risks were discussed with the patient.                            All questions were answered, and informed consent                            was obtained. Prior Anticoagulants: The patient has                            taken no previous anticoagulant or antiplatelet                            agents. ASA Grade Assessment: II - A patient with                            mild systemic disease. After reviewing the risks                            and benefits, the patient was deemed in  satisfactory condition to undergo the procedure.                           After obtaining informed consent, the colonoscope                            was passed under direct vision. Throughout the                            procedure, the patient's blood pressure, pulse, and                            oxygen saturations were monitored continuously. The                            Model CF-HQ190L [id number]) scope was introduced                         through the anus and advanced to the the cecum,                            identified by appendiceal orifice and ileocecal                            valve. The colonoscopy was performed without                            difficulty. The patient tolerated the procedure                            well. The quality of the bowel preparation was                            good. The bowel preparation used was Miralax. The                            ileocecal valve, appendiceal orifice, and rectum                            were photographed. Scope In: 11:49:59 AM Scope Out: 12:07:03 PM Scope Withdrawal Time: 0 hours 10 minutes 18 seconds  Total Procedure Duration: 0 hours 17 minutes 4 seconds  Findings:                 The perianal and digital rectal examinations were                            normal.                           A 4 mm polyp was found in the distal ascending                            colon. The polyp was sessile. The polyp was removed  with a cold snare. Resection was complete, and                            retrieval was complete.                           A 4 mm polyp was found in the mid descending colon.                            The polyp was sessile. The polyp was removed with a                            cold snare. Resection and retrieval were complete.                           Internal hemorrhoids were found during                            retroflexion. The hemorrhoids were Grade I                            (internal hemorrhoids that do not prolapse).                           The exam was otherwise without abnormality. Complications:            No immediate complications. Estimated Blood Loss:     Estimated blood loss: none. Impression:               - One 4 mm polyp in the distal ascending colon,                            removed with a cold snare. Resected and retrieved.                           - One 4 mm  polyp in the mid descending colon,                            removed with a cold snare. Resected and retrieved.                           - Internal hemorrhoids.                           - The examination was otherwise normal. Recommendation:           - Patient has a contact number available for                            emergencies. The signs and symptoms of potential                            delayed complications were discussed with the  patient. Return to normal activities tomorrow.                            Written discharge instructions were provided to the                            patient.                           - Resume previous diet.                           - Continue present medications.                           - Await pathology results.                           - No recommendation at this time regarding repeat                            colonoscopy due to age. Greely Atiyeh L. Loletha Carrow, MD 06/15/2015 12:15:30 PM This report has been signed electronically.

## 2015-06-15 NOTE — Progress Notes (Signed)
Called to room to assist during endoscopic procedure.  Patient ID and intended procedure confirmed with present staff. Received instructions for my participation in the procedure from the performing physician.  

## 2015-06-15 NOTE — Patient Instructions (Signed)
YOU HAD AN ENDOSCOPIC PROCEDURE TODAY AT THE [facility]:   Refer to the procedure report that was given to you for any specific questions about what was found during the examination.  If the procedure report does not answer your questions, please call your gastroenterologist to clarify.  If you requested that your care partner not be given the details of your procedure findings, then the procedure report has been included in a sealed envelope for you to review at your convenience later.  YOU SHOULD EXPECT: Some feelings of bloating in the abdomen. Passage of more gas than usual.  Walking can help get rid of the air that was put into your GI tract during the procedure and reduce the bloating. If you had a lower endoscopy (such as a colonoscopy or flexible sigmoidoscopy) you may notice spotting of blood in your stool or on the toilet paper. If you underwent a bowel prep for your procedure, you may not have a normal bowel movement for a few days.  Please Note:  You might notice some irritation and congestion in your nose or some drainage.  This is from the oxygen used during your procedure.  There is no need for concern and it should clear up in a day or so.  SYMPTOMS TO REPORT IMMEDIATELY:   Following lower endoscopy (colonoscopy or flexible sigmoidoscopy):  Excessive amounts of blood in the stool  Significant tenderness or worsening of abdominal pains  Swelling of the abdomen that is new, acute  Fever of 100F or higher   For urgent or emergent issues, a gastroenterologist can be reached at any hour by calling [phone].   DIET: Your first meal following the procedure should be a small meal and then it is ok to progress to your normal diet. Heavy or fried foods are harder to digest and may make you feel nauseous or bloated.  Likewise, meals heavy in dairy and vegetables can increase bloating.  Drink plenty of fluids but you should avoid alcoholic beverages for 24  hours.  ACTIVITY:  You should plan to take it easy for the rest of today and you should NOT DRIVE or use heavy machinery until tomorrow (because of the sedation medicines used during the test).    FOLLOW UP: Our staff will call the number listed on your records the next business day following your procedure to check on you and address any questions or concerns that you may have regarding the information given to you following your procedure. If we do not reach you, we will leave a message.  However, if you are feeling well and you are not experiencing any problems, there is no need to return our call.  We will assume that you have returned to your regular daily activities without incident.  If any biopsies were taken you will be contacted by phone or by letter within the next 1-3 weeks.  Please call us at [phone] if you have not heard about the biopsies in 3 weeks.    SIGNATURES/CONFIDENTIALITY: You and/or your care partner have signed paperwork which will be entered into your electronic medical record.  These signatures attest to the fact that that the information above on your After Visit Summary has been reviewed and is understood.  Full responsibility of the confidentiality of this discharge information lies with you and/or your care-partner.  Polyp and hemorrhoid information given. 

## 2015-06-16 ENCOUNTER — Telehealth: Payer: Self-pay

## 2015-06-16 NOTE — Telephone Encounter (Signed)
  Follow up Call-  Call back number 06/15/2015  Post procedure Call Back phone  # [phone]  Permission to leave phone message No     Patient questions:  Do you have a fever, pain , or abdominal swelling? No. Pain Score  0 *  Have you tolerated food without any problems? Yes.    Have you been able to return to your normal activities? Yes.    Do you have any questions about your discharge instructions: Diet   No. Medications  No. Follow up visit  No.  Do you have questions or concerns about your Care? No.  Actions: * If pain score is 4 or above: No action needed, pain <4.

## 2015-06-22 ENCOUNTER — Encounter: Payer: Self-pay | Admitting: Gastroenterology

## 2015-06-29 DIAGNOSIS — L814 Other melanin hyperpigmentation: Secondary | ICD-10-CM | POA: Diagnosis not present

## 2015-06-29 DIAGNOSIS — D235 Other benign neoplasm of skin of trunk: Secondary | ICD-10-CM | POA: Diagnosis not present

## 2015-06-29 DIAGNOSIS — L255 Unspecified contact dermatitis due to plants, except food: Secondary | ICD-10-CM | POA: Diagnosis not present

## 2015-06-29 DIAGNOSIS — D1801 Hemangioma of skin and subcutaneous tissue: Secondary | ICD-10-CM | POA: Diagnosis not present

## 2015-06-29 DIAGNOSIS — L820 Inflamed seborrheic keratosis: Secondary | ICD-10-CM | POA: Diagnosis not present

## 2015-06-29 DIAGNOSIS — Z85820 Personal history of malignant melanoma of skin: Secondary | ICD-10-CM | POA: Diagnosis not present

## 2015-08-01 ENCOUNTER — Other Ambulatory Visit: Payer: Medicare Other

## 2015-09-16 DIAGNOSIS — G4733 Obstructive sleep apnea (adult) (pediatric): Secondary | ICD-10-CM | POA: Diagnosis not present

## 2015-10-03 ENCOUNTER — Other Ambulatory Visit (INDEPENDENT_AMBULATORY_CARE_PROVIDER_SITE_OTHER): Payer: Medicare Other

## 2015-10-03 DIAGNOSIS — E119 Type 2 diabetes mellitus without complications: Secondary | ICD-10-CM | POA: Diagnosis not present

## 2015-10-03 LAB — HEMOGLOBIN A1C: HEMOGLOBIN A1C: 6.5 % (ref 4.6–6.5)

## 2015-10-04 ENCOUNTER — Telehealth: Payer: Self-pay | Admitting: Family Medicine

## 2015-10-04 NOTE — Telephone Encounter (Signed)
Pt called checking on his lab results from yesterday

## 2015-10-04 NOTE — Telephone Encounter (Signed)
Spoken and notified patient of Dr Josefine Class comments. Patient verbalized understanding.  Patient will call back to schedule appt. Patient wants to schedule his physical.

## 2015-10-12 DIAGNOSIS — G4733 Obstructive sleep apnea (adult) (pediatric): Secondary | ICD-10-CM | POA: Diagnosis not present

## 2015-10-12 DIAGNOSIS — R5383 Other fatigue: Secondary | ICD-10-CM | POA: Diagnosis not present

## 2015-10-17 DIAGNOSIS — L570 Actinic keratosis: Secondary | ICD-10-CM | POA: Diagnosis not present

## 2015-10-17 DIAGNOSIS — L821 Other seborrheic keratosis: Secondary | ICD-10-CM | POA: Diagnosis not present

## 2015-12-01 ENCOUNTER — Ambulatory Visit
Admission: RE | Admit: 2015-12-01 | Discharge: 2015-12-01 | Disposition: A | Discharge status: Home / Self Care | Payer: Medicare Other | Source: Ambulatory Visit | Attending: Dermatology | Admitting: Dermatology

## 2015-12-01 ENCOUNTER — Other Ambulatory Visit: Payer: Self-pay | Admitting: Dermatology

## 2015-12-01 DIAGNOSIS — Z85820 Personal history of malignant melanoma of skin: Secondary | ICD-10-CM

## 2015-12-08 DIAGNOSIS — G4733 Obstructive sleep apnea (adult) (pediatric): Secondary | ICD-10-CM | POA: Diagnosis not present

## 2015-12-19 ENCOUNTER — Encounter: Payer: Self-pay | Admitting: Family Medicine

## 2015-12-19 ENCOUNTER — Ambulatory Visit (INDEPENDENT_AMBULATORY_CARE_PROVIDER_SITE_OTHER): Payer: Medicare Other | Admitting: Family Medicine

## 2015-12-19 VITALS — BP 104/58 | HR 65 | Temp 98.4°F | Ht 66.0 in | Wt 177.2 lb

## 2015-12-19 DIAGNOSIS — Z23 Encounter for immunization: Secondary | ICD-10-CM | POA: Diagnosis not present

## 2015-12-19 DIAGNOSIS — E119 Type 2 diabetes mellitus without complications: Secondary | ICD-10-CM

## 2015-12-19 DIAGNOSIS — Z0000 Encounter for general adult medical examination without abnormal findings: Secondary | ICD-10-CM

## 2015-12-19 DIAGNOSIS — E785 Hyperlipidemia, unspecified: Secondary | ICD-10-CM

## 2015-12-19 MED ORDER — PRAVASTATIN SODIUM 20 MG PO TABS: 20.0000 mg | ORAL_TABLET | Freq: Every day | ORAL | 3 refills | Status: DC

## 2015-12-19 NOTE — Progress Notes (Signed)
Pre visit review using our clinic review tool, if applicable. No additional management support is needed unless otherwise documented below in the visit note. 

## 2015-12-19 NOTE — Progress Notes (Signed)
I have personally reviewed the Medicare Annual Wellness questionnaire and have noted 1. The patient's medical and social history 2. Their use of alcohol, tobacco or illicit drugs 3. Their current medications and supplements 4. The patient's functional ability including ADL's, fall risks, home safety risks and hearing or visual             impairment. 5. Diet and physical activities 6. Evidence for depression or mood disorders  The patients weight, height, BMI have been recorded in the chart and visual acuity is per eye clinic.  I have made referrals, counseling and provided education to the patient based review of the above and I have provided the pt with a written personalized care plan for preventive services.  Provider list updated- see scanned forms.  Routine anticipatory guidance given to patient.  See health maintenance.  Flu shot 2017 Shingles 2013  PNA 2014  Tetanus d/w pt.  Colonoscopy 2017.    Prostate cancer screening and PSA options (with potential risks and benefits of testing vs not testing) were discussed along with recent recs/guidelines.  He declined testing PSA at this point. Advance directive- living will done by patient. Wife would be designated if incapacitated.  Cognitive function addressed- see scanned forms- and if abnormal then additional documentation follows.   He is caring for his wife who is now requiring O2 at night.  She is slowly getting stronger after having been in the hospital.    Elevated Cholesterol: Using medications without problems:yes Muscle aches: no Diet compliance:yes Exercise:yes  DM2.  Last A1c at goal.  No meds.  D/w pt.  Not due for f/u A1c at this point.  He has worked on diet and exercise, with intentional weight loss noted.   PMH and SH reviewed  Meds, vitals, and allergies reviewed.   ROS: Per HPI.  Unless specifically indicated otherwise in HPI, the patient denies:  General: fever. Eyes: acute vision changes ENT: sore  throat Cardiovascular: chest pain Respiratory: SOB GI: vomiting GU: dysuria Musculoskeletal: acute back pain Derm: acute rash Neuro: acute motor dysfunction Psych: worsening mood Endocrine: polydipsia Heme: bleeding Allergy: hayfever  GEN: nad, alert and oriented HEENT: mucous membranes moist NECK: supple w/o LA CV: rrr. PULM: ctab, no inc wob ABD: soft, +bs EXT: no edema SKIN: no acute rash, SKs noted.   Diabetic foot exam: Normal inspection No skin breakdown No calluses  Normal DP pulses Normal sensation to light touch and monofilament Nails normal

## 2015-12-19 NOTE — Patient Instructions (Addendum)
Check with your insurance to see if they will cover the Tdap (tetanus) shot. Go to the lab on the way out.  We'll contact you with your lab report. Take care.  Glad to see you.  Update me as needed.  Recheck A1c in about 6 months.

## 2015-12-20 LAB — MICROALBUMIN / CREATININE URINE RATIO
Creatinine,U: 145 mg/dL
Microalb Creat Ratio: 0.8 mg/g (ref 0.0–30.0)
Microalb, Ur: 1.1 mg/dL (ref 0.0–1.9)

## 2015-12-20 LAB — BASIC METABOLIC PANEL
BUN: 12 mg/dL (ref 6–23)
CO2: 30 meq/L (ref 19–32)
Calcium: 9.5 mg/dL (ref 8.4–10.5)
Chloride: 102 mEq/L (ref 96–112)
Creatinine, Ser: 0.54 mg/dL (ref 0.40–1.50)
GFR: 158.28 mL/min (ref >60.00)
GLUCOSE: 91 mg/dL (ref 70–99)
POTASSIUM: 4.4 meq/L (ref 3.5–5.1)
SODIUM: 139 meq/L (ref 135–145)

## 2015-12-20 NOTE — Assessment & Plan Note (Signed)
Controlled, no ADE on statin, continue as is.  Last set of lipids d/w pt.  Continue diet and exercise.

## 2015-12-20 NOTE — Assessment & Plan Note (Signed)
Flu shot 2017 Shingles 2013  PNA 2014  Tetanus d/w pt.  Colonoscopy 2017.    Prostate cancer screening and PSA options (with potential risks and benefits of testing vs not testing) were discussed along with recent recs/guidelines.  He declined testing PSA at this point. Advance directive- living will done by patient. Wife would be designated if incapacitated.  Cognitive function addressed- see scanned forms- and if abnormal then additional documentation follows.

## 2015-12-20 NOTE — Addendum Note (Signed)
Addended by: Josetta Huddle on: 12/20/2015 09:13 AM   Modules accepted: Orders

## 2015-12-20 NOTE — Assessment & Plan Note (Signed)
Controlled, no meds currently, continue as is.  Last A1c d/w pt.  Continue diet and exercise.  Recheck in about 6 months.

## 2015-12-28 DIAGNOSIS — G4733 Obstructive sleep apnea (adult) (pediatric): Secondary | ICD-10-CM | POA: Diagnosis not present

## 2015-12-30 ENCOUNTER — Other Ambulatory Visit: Payer: Self-pay

## 2016-03-23 ENCOUNTER — Other Ambulatory Visit: Payer: Self-pay

## 2016-03-23 MED ORDER — PRAVASTATIN SODIUM 20 MG PO TABS: 20.0000 mg | ORAL_TABLET | Freq: Every day | ORAL | 2 refills | Status: DC

## 2016-03-23 NOTE — Telephone Encounter (Signed)
Pt is in [facility] and does not want to use [facility] any longer. Pt request refill to [facility]; last annual 11/2015. Pt advised done and pt voiced understanding.

## 2016-03-26 DIAGNOSIS — E7800 Pure hypercholesterolemia, unspecified: Secondary | ICD-10-CM | POA: Diagnosis not present

## 2016-03-26 DIAGNOSIS — G4733 Obstructive sleep apnea (adult) (pediatric): Secondary | ICD-10-CM | POA: Diagnosis not present

## 2016-04-02 ENCOUNTER — Other Ambulatory Visit: Payer: Self-pay | Admitting: Dermatology

## 2016-04-02 DIAGNOSIS — D485 Neoplasm of uncertain behavior of skin: Secondary | ICD-10-CM | POA: Diagnosis not present

## 2016-04-02 DIAGNOSIS — D1801 Hemangioma of skin and subcutaneous tissue: Secondary | ICD-10-CM | POA: Diagnosis not present

## 2016-04-02 DIAGNOSIS — C44311 Basal cell carcinoma of skin of nose: Secondary | ICD-10-CM | POA: Diagnosis not present

## 2016-04-02 DIAGNOSIS — D235 Other benign neoplasm of skin of trunk: Secondary | ICD-10-CM | POA: Diagnosis not present

## 2016-04-02 DIAGNOSIS — Z85820 Personal history of malignant melanoma of skin: Secondary | ICD-10-CM | POA: Diagnosis not present

## 2016-04-02 DIAGNOSIS — L821 Other seborrheic keratosis: Secondary | ICD-10-CM | POA: Diagnosis not present

## 2016-05-04 ENCOUNTER — Other Ambulatory Visit: Payer: Self-pay | Admitting: Family Medicine

## 2016-05-04 DIAGNOSIS — E119 Type 2 diabetes mellitus without complications: Secondary | ICD-10-CM

## 2016-05-07 ENCOUNTER — Other Ambulatory Visit (INDEPENDENT_AMBULATORY_CARE_PROVIDER_SITE_OTHER): Payer: Medicare Other

## 2016-05-07 DIAGNOSIS — E119 Type 2 diabetes mellitus without complications: Secondary | ICD-10-CM | POA: Diagnosis not present

## 2016-05-07 LAB — HEMOGLOBIN A1C: HEMOGLOBIN A1C: 6.8 % — AB (ref 4.6–6.5)

## 2016-05-09 DIAGNOSIS — L821 Other seborrheic keratosis: Secondary | ICD-10-CM | POA: Diagnosis not present

## 2016-05-09 DIAGNOSIS — L304 Erythema intertrigo: Secondary | ICD-10-CM | POA: Diagnosis not present

## 2016-05-09 DIAGNOSIS — L814 Other melanin hyperpigmentation: Secondary | ICD-10-CM | POA: Diagnosis not present

## 2016-05-09 DIAGNOSIS — Z85828 Personal history of other malignant neoplasm of skin: Secondary | ICD-10-CM | POA: Diagnosis not present

## 2016-05-09 DIAGNOSIS — Z85820 Personal history of malignant melanoma of skin: Secondary | ICD-10-CM | POA: Diagnosis not present

## 2016-05-14 ENCOUNTER — Other Ambulatory Visit: Payer: Medicare Other

## 2016-07-04 DIAGNOSIS — H35363 Drusen (degenerative) of macula, bilateral: Secondary | ICD-10-CM | POA: Diagnosis not present

## 2016-08-06 ENCOUNTER — Ambulatory Visit: Admit: 2016-08-06 | Payer: Medicare Other | Admitting: Unknown Physician Specialty

## 2016-08-06 SURGERY — COLONOSCOPY WITH PROPOFOL
Anesthesia: General

## 2016-10-01 DIAGNOSIS — E7800 Pure hypercholesterolemia, unspecified: Secondary | ICD-10-CM | POA: Diagnosis not present

## 2016-10-01 DIAGNOSIS — G4733 Obstructive sleep apnea (adult) (pediatric): Secondary | ICD-10-CM | POA: Diagnosis not present

## 2016-10-18 ENCOUNTER — Telehealth: Payer: Self-pay | Admitting: Family Medicine

## 2016-10-18 DIAGNOSIS — Z0110 Encounter for examination of ears and hearing without abnormal findings: Secondary | ICD-10-CM

## 2016-10-18 NOTE — Telephone Encounter (Signed)
PT called to request an order faxed to [facility] for an ear check- appt is tomorrow 9/28 @ 9:30amdun. Fax [phone], [place] ears checked. It has been about 3 yrs since last ck. Clinic [phone] (or possibly [phone] area code). Any questions please call PT on cell.

## 2016-10-18 NOTE — Telephone Encounter (Signed)
Pt called. He says he needs this referral done today, as he has to drive to [facility] for the appt.   Please call pt at [phone]

## 2016-10-18 NOTE — Telephone Encounter (Signed)
referral placed

## 2016-10-19 NOTE — Telephone Encounter (Signed)
Hearing eval referral was faxed to the number provided.

## 2016-10-22 ENCOUNTER — Telehealth: Payer: Self-pay | Admitting: Family Medicine

## 2016-10-22 NOTE — Telephone Encounter (Signed)
Haven't seen the form.

## 2016-10-22 NOTE — Telephone Encounter (Signed)
Caller Name:Lieutenant Boshers  Relationship to Patient:self Best [phone]  Pharmacy:  Reason for call:  Pt wants to make sure the you got fax from [facility](was faxed Friday 10/19/16)  and that you can prescribe the medication that is suggested. Pt request cb

## 2016-10-22 NOTE — Telephone Encounter (Signed)
The note has no mention of a "blockage" or a medicine to use.  I have no idea what he means.   Please get details.   And usually the dx'ing MD is the rx'ing MD, to prevent potential miscommunication.

## 2016-10-22 NOTE — Telephone Encounter (Signed)
Patient contacted and will ask [facility] to re-fax, directly to the fax in the back hall.

## 2016-10-22 NOTE — Telephone Encounter (Signed)
Fax received and placed on Dr. Josefine Class Desk.  Patient says he was told he had a blockage and that he needed a Rx and patient told [facility] that he would get his PCP to prescribe the medication.    Patient also says [facility] did not get the referral that they needed for his visit.

## 2016-10-23 NOTE — Telephone Encounter (Signed)
Patient advised.

## 2016-10-25 NOTE — Telephone Encounter (Signed)
Refaxed Referral to New Fax number [phone]

## 2016-10-25 NOTE — Telephone Encounter (Signed)
Mandy from [facility] called - they never received the referral. They have been having trouble with their fax. Can you please refax to alternate fax number? Fax number [phone] Thanks

## 2016-10-26 ENCOUNTER — Ambulatory Visit (INDEPENDENT_AMBULATORY_CARE_PROVIDER_SITE_OTHER): Payer: Medicare Other | Admitting: Primary Care

## 2016-10-26 ENCOUNTER — Encounter: Payer: Self-pay | Admitting: Primary Care

## 2016-10-26 VITALS — BP 128/54 | HR 69 | Temp 87.7°F | Wt 179.0 lb

## 2016-10-26 DIAGNOSIS — H65192 Other acute nonsuppurative otitis media, left ear: Secondary | ICD-10-CM | POA: Diagnosis not present

## 2016-10-26 MED ORDER — FLUTICASONE PROPIONATE 50 MCG/ACT NA SUSP: 1.0000 | Freq: Two times a day (BID) | NASAL | 0 refills | Status: DC

## 2016-10-26 NOTE — Patient Instructions (Signed)
There is no wax in either ears.  There's a minimal amount of fluid behind the left ear. Try using Flonase (fluticasone) nasal spray. Instill 1 spray in each nostril twice daily.   It was a pleasure meeting you!

## 2016-10-26 NOTE — Progress Notes (Signed)
Subjective:    Patient ID: Breland L. Matters, male    DOB: 1940-06-16, 76 y.o.   MRN: [id number]  HPI  Mr. Kopf is a 76 year old male who presents today with a chief complaint of  He was evaluated at [facility] on 10/19/16 for a check up on his hearing aids. He was told that he had a "blockage", but the hearing doctor never looked inside his ears. He picked up debrox drops at the pharmacy as he thought that maybe he had cerumen impaction. He's used the debrox drops several times since his visit with ENT. He wears hearing aids daily. He denies ear pain, sore throat, fevers, cough.  Review of Systems  Constitutional: Negative for fever.  HENT: Negative for ear pain and sore throat.   Respiratory: Negative for cough.        Past Medical History:  Diagnosis Date  . Acne    Dr. Allyson Sabal  . Diabetes mellitus    h/o Type 2/no meds  . Hyperlipidemia   . Skin cancer    on back     Social History   Social History  . Marital status: Married    Spouse name: N/A  . Number of children: N/A  . Years of education: N/A   Occupational History  . 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 Smoking status: Never Smoker  . Smokeless tobacco: Never Used  . Alcohol use 0.0 oz/week     Comment: Occasional  . Drug use: No  . Sexual activity: Not on file   Other Topics Concern  . Not on file   Social History Narrative   Regular exercise - walking frequently   Married 1962   Twin daughters, 3 grandkids and 6 great grandkids   Splits time between [place] and [employer]   Working Leisure centre manager    Past Surgical History:  Procedure Laterality Date  . APPENDECTOMY  7th grade  . COLONOSCOPY    . Melanoma Excision of back  1996   Dr. Allyson Sabal  . POLYPECTOMY      Family History  Problem Relation Age of Onset  . Lung cancer Mother        smoker  . Colon cancer Neg Hx        none known  . Prostate cancer Neg Hx        none known    No  Known Allergies  Current Outpatient Prescriptions on File Prior to Visit  Medication Sig Dispense Refill  . pravastatin (PRAVACHOL) 20 MG tablet Take 1 tablet (20 mg total) by mouth daily. 90 tablet 2   No current facility-administered medications on file prior to visit.     BP (!) 128/54   Pulse 69   Temp (!) 87.7 F (30.9 C) (Oral)   Wt 179 lb (81.2 kg)   SpO2 97%   BMI 28.89 kg/m    Objective:   Physical Exam  Constitutional: He appears well-nourished.  HENT:  Right Ear: Tympanic membrane and ear canal normal.  Left Ear: Ear canal normal. A middle ear effusion is present.  Nose: No mucosal edema. Right sinus exhibits no maxillary sinus tenderness and no frontal sinus tenderness. Left sinus exhibits no maxillary sinus tenderness and no frontal sinus tenderness.  Mouth/Throat: Oropharynx is clear and moist.  No cerumen impaction to either canal.  Eyes: Conjunctivae are normal.  Neck: Neck supple.  Cardiovascular: Normal rate and regular rhythm.  Pulmonary/Chest: Effort normal and breath sounds normal. He has no wheezes. He has no rales.  Skin: Skin is warm and dry.          Assessment & Plan:  Ear Effusion:  No complaints.  Evaluated today as his audiologist told him that he has a "blockage". Exam today with mild effusion to left TM, otherwise no cerumen impaction or evidence of infection. Discussed use of Flonase PRN. Follow up PRN.  Sheral Flow, NP

## 2016-10-31 ENCOUNTER — Other Ambulatory Visit: Payer: Self-pay | Admitting: Dermatology

## 2016-10-31 ENCOUNTER — Ambulatory Visit
Admission: RE | Admit: 2016-10-31 | Discharge: 2016-10-31 | Disposition: A | Discharge status: Home / Self Care | Payer: Medicare Other | Source: Ambulatory Visit | Attending: Dermatology | Admitting: Dermatology

## 2016-10-31 DIAGNOSIS — R918 Other nonspecific abnormal finding of lung field: Secondary | ICD-10-CM | POA: Diagnosis not present

## 2016-10-31 DIAGNOSIS — Z85820 Personal history of malignant melanoma of skin: Secondary | ICD-10-CM

## 2016-10-31 DIAGNOSIS — D030 Melanoma in situ of lip: Secondary | ICD-10-CM

## 2016-11-08 DIAGNOSIS — D225 Melanocytic nevi of trunk: Secondary | ICD-10-CM | POA: Diagnosis not present

## 2016-11-08 DIAGNOSIS — Z85828 Personal history of other malignant neoplasm of skin: Secondary | ICD-10-CM | POA: Diagnosis not present

## 2016-11-08 DIAGNOSIS — L821 Other seborrheic keratosis: Secondary | ICD-10-CM | POA: Diagnosis not present

## 2016-11-08 DIAGNOSIS — Z85820 Personal history of malignant melanoma of skin: Secondary | ICD-10-CM | POA: Diagnosis not present

## 2016-11-08 DIAGNOSIS — L820 Inflamed seborrheic keratosis: Secondary | ICD-10-CM | POA: Diagnosis not present

## 2016-11-08 DIAGNOSIS — L814 Other melanin hyperpigmentation: Secondary | ICD-10-CM | POA: Diagnosis not present

## 2016-11-08 DIAGNOSIS — L570 Actinic keratosis: Secondary | ICD-10-CM | POA: Diagnosis not present

## 2016-12-20 ENCOUNTER — Encounter: Payer: Medicare Other | Admitting: Family Medicine

## 2017-01-01 ENCOUNTER — Encounter: Payer: Medicare Other | Admitting: Family Medicine

## 2017-01-02 ENCOUNTER — Encounter: Payer: Self-pay | Admitting: Family Medicine

## 2017-01-02 ENCOUNTER — Ambulatory Visit (INDEPENDENT_AMBULATORY_CARE_PROVIDER_SITE_OTHER): Payer: Medicare Other | Admitting: Family Medicine

## 2017-01-02 VITALS — BP 120/74 | HR 72 | Temp 98.5°F | Wt 190.0 lb

## 2017-01-02 DIAGNOSIS — M5432 Sciatica, left side: Secondary | ICD-10-CM | POA: Diagnosis not present

## 2017-01-02 DIAGNOSIS — E119 Type 2 diabetes mellitus without complications: Secondary | ICD-10-CM

## 2017-01-02 LAB — HEMOGLOBIN A1C: HEMOGLOBIN A1C: 7.3 % — AB (ref 4.6–6.5)

## 2017-01-02 MED ORDER — HYDROCODONE-ACETAMINOPHEN 5-325 MG PO TABS: 1.0000 | ORAL_TABLET | Freq: Four times a day (QID) | ORAL | 0 refills | Status: DC | PRN

## 2017-01-02 NOTE — Progress Notes (Signed)
L hip and leg pain.  No R sided sx.  Pain radiates down the L leg, down the lateral side of the thigh and posterior midcalf.  No trauma but he had been lifting his wife's O2 tank about 2 days prior to the sx started.  No FCNAVD.  He doesn't feel unwell.  It tingles and hurts locally on the L leg, in dermatomal pattern.  Certain positions worse than others.  Able to bear weight.  No urinary sx.    He is still using his cpap.  He his going to [place] soon.    Meds, vitals, and allergies reviewed.   ROS: Per HPI unless specifically indicated in ROS section   nad ncat Neck supple Normal grip B rrr Ctab abd soft Back not ttp in midline L SLR positive but able to bear weight, no rash.  Distally NV intact BLE, no weakness for dorsiflexion or plantar flexion of the feet bilaterally.

## 2017-01-02 NOTE — Patient Instructions (Addendum)
Take OTC motrin up to 600mg  3 times a day with food.  Take vicodin if needed for pain.  Don't take it with extra tylenol.  Use the back exercises.  Update me as needed.  Take care.  Glad to see you.

## 2017-01-03 ENCOUNTER — Telehealth: Payer: Self-pay | Admitting: Family Medicine

## 2017-01-03 ENCOUNTER — Other Ambulatory Visit: Payer: Self-pay | Admitting: *Deleted

## 2017-01-03 DIAGNOSIS — M5430 Sciatica, unspecified side: Secondary | ICD-10-CM | POA: Insufficient documentation

## 2017-01-03 MED ORDER — PREDNISONE 10 MG PO TABS: ORAL_TABLET | ORAL | 0 refills | Status: DC

## 2017-01-03 MED ORDER — PRAVASTATIN SODIUM 20 MG PO TABS: 20.0000 mg | ORAL_TABLET | Freq: Every day | ORAL | 2 refills | Status: DC

## 2017-01-03 NOTE — Telephone Encounter (Signed)
Called patient back and was advised that he has already talked with Lugene and he is good. Patient stated that he does not have any more questions.

## 2017-01-03 NOTE — Telephone Encounter (Signed)
Copied from [place] [id number]. Topic: General - Other >> Jan 03, 2017 10:43 AM Darl Householder, RMA wrote: Reason for CRM: patient is requesting a callback from Dr. Damita Dunnings concerning medication, pls call pt  [phone]

## 2017-01-03 NOTE — Assessment & Plan Note (Signed)
Take OTC motrin up to 600mg  3 times a day with food.  Take vicodin if needed for pain.  Take take it with extra tylenol.  Use the back exercises in handout- d/w pt, given to patient.   If not better in a few days then stop ibuprofen/motrin/advil and start prednisone rx.  See notes on A1c re: pred and sugar.  Okay for outpatient follow-up.

## 2017-01-08 ENCOUNTER — Other Ambulatory Visit: Payer: Self-pay | Admitting: Dermatology

## 2017-01-08 DIAGNOSIS — C44319 Basal cell carcinoma of skin of other parts of face: Secondary | ICD-10-CM | POA: Diagnosis not present

## 2017-01-11 ENCOUNTER — Telehealth: Payer: Self-pay | Admitting: Family Medicine

## 2017-01-11 MED ORDER — HYDROCODONE-ACETAMINOPHEN 5-325 MG PO TABS: 1.0000 | ORAL_TABLET | Freq: Four times a day (QID) | ORAL | 0 refills | Status: DC | PRN

## 2017-01-11 NOTE — Telephone Encounter (Signed)
Rx printed and handed to wife.

## 2017-01-11 NOTE — Telephone Encounter (Signed)
I am not at clinic currently.  This is a reliable patient.  I would normally fill this med.  I need another doc to sign the rx, since I am out of clinic.  If not better, then I can put in referral for ortho/PT depending on his needs.   Please ask a doc in clinic about filling this rx.   Let me know if referral is needed.  Thanks.

## 2017-01-11 NOTE — Telephone Encounter (Signed)
REFILL HYDROCODONE 5-325 MG going to [place]12/26/18 to Feb 2,2019. [phone] cell phone . [facility] [address]

## 2017-01-11 NOTE — Telephone Encounter (Signed)
Many thanks. 

## 2017-01-11 NOTE — Telephone Encounter (Signed)
Pt choosing to wait so he can take script to pharmacy while he is here in town and his wife is being seen right now with her provider at [facility]. HYDROCODONE 5-325 MG.

## 2017-02-25 ENCOUNTER — Ambulatory Visit (INDEPENDENT_AMBULATORY_CARE_PROVIDER_SITE_OTHER): Payer: Medicare Other | Admitting: Family Medicine

## 2017-02-25 ENCOUNTER — Encounter: Payer: Self-pay | Admitting: Family Medicine

## 2017-02-25 VITALS — BP 128/72 | HR 83 | Temp 97.6°F | Ht 66.0 in | Wt 185.4 lb

## 2017-02-25 DIAGNOSIS — E785 Hyperlipidemia, unspecified: Secondary | ICD-10-CM

## 2017-02-25 DIAGNOSIS — E119 Type 2 diabetes mellitus without complications: Secondary | ICD-10-CM | POA: Diagnosis not present

## 2017-02-25 DIAGNOSIS — Z7189 Other specified counseling: Secondary | ICD-10-CM

## 2017-02-25 DIAGNOSIS — M5432 Sciatica, left side: Secondary | ICD-10-CM | POA: Diagnosis not present

## 2017-02-25 DIAGNOSIS — Z0000 Encounter for general adult medical examination without abnormal findings: Secondary | ICD-10-CM

## 2017-02-25 DIAGNOSIS — G4733 Obstructive sleep apnea (adult) (pediatric): Secondary | ICD-10-CM | POA: Diagnosis not present

## 2017-02-25 LAB — LIPID PANEL
CHOLESTEROL: 168 mg/dL (ref 0–200)
HDL: 56.5 mg/dL (ref >39.00)
LDL Cholesterol: 98 mg/dL (ref 0–99)
NonHDL: 111.1
Total CHOL/HDL Ratio: 3
Triglycerides: 64 mg/dL (ref 0.0–149.0)
VLDL: 12.8 mg/dL (ref 0.0–40.0)

## 2017-02-25 LAB — COMPREHENSIVE METABOLIC PANEL
ALK PHOS: 51 U/L (ref 39–117)
ALT: 27 U/L (ref 0–53)
AST: 19 U/L (ref 0–37)
Albumin: 4.7 g/dL (ref 3.5–5.2)
BUN: 14 mg/dL (ref 6–23)
CO2: 29 mEq/L (ref 19–32)
Calcium: 9.5 mg/dL (ref 8.4–10.5)
Chloride: 101 mEq/L (ref 96–112)
Creatinine, Ser: 0.57 mg/dL (ref 0.40–1.50)
GFR: 148.23 mL/min (ref >60.00)
GLUCOSE: 145 mg/dL — AB (ref 70–99)
POTASSIUM: 3.7 meq/L (ref 3.5–5.1)
Sodium: 139 mEq/L (ref 135–145)
Total Bilirubin: 0.8 mg/dL (ref 0.2–1.2)
Total Protein: 7.7 g/dL (ref 6.0–8.3)

## 2017-02-25 NOTE — Progress Notes (Signed)
I have personally reviewed the Medicare Annual Wellness questionnaire and have noted 1. The patient's medical and social history 2. Their use of alcohol, tobacco or illicit drugs 3. Their current medications and supplements 4. The patient's functional ability including ADL's, fall risks, home safety risks and hearing or visual             impairment. 5. Diet and physical activities 6. Evidence for depression or mood disorders  The patients weight, height, BMI have been recorded in the chart and visual acuity is per eye clinic.  I have made referrals, counseling and provided education to the patient based review of the above and I have provided the pt with a written personalized care plan for preventive services.  Provider list updated- see scanned forms.  Routine anticipatory guidance given to patient.  See health maintenance. The possibility exists that previously documented standard health maintenance information may have been brought forward from a previous encounter into this note.  If needed, that same information has been updated to reflect the current situation based on today's encounter.    Flu shot declined.   Shingles 2013 PNA 2014  Tetanus d/w pt. would be cheaper at the pharmacy, d/w pt.   Colonoscopy 2017.   Prostate cancer screening and PSA options(with potential risks and benefits of testing vs not testing) were discussed along with recent recs/guidelines. He declined testing PSAat this point. Advance directive- living will done by patient. Wife would be designated if incapacitated.  Cognitive function addressed- see scanned forms- and if abnormal then additional documentation follows.   Elevated Cholesterol: Using medications without problems:yes Muscle aches: not from statin Diet compliance:yes Exercise:yes Labs pending.    He is still using CPAP at night.  It helps.  Compliant.  Snoring is better on CPAP.  6-8 hours per night, 6-7 nights per week.    His leg  is better but he still has some occ flares of pain.  Sitting in the recliner he'll have some stinging in the L calf.  He can ride in the car w/o troubles.  D/w pt.  Likely mechanical, d/w pt.    PMH and SH reviewed  Meds, vitals, and allergies reviewed.   ROS: Per HPI.  Unless specifically indicated otherwise in HPI, the patient denies:  General: fever. Eyes: acute vision changes ENT: sore throat Cardiovascular: chest pain Respiratory: SOB GI: vomiting GU: dysuria Musculoskeletal: acute back pain Derm: acute rash Neuro: acute motor dysfunction Psych: worsening mood Endocrine: polydipsia Heme: bleeding Allergy: hayfever  GEN: nad, alert and oriented HEENT: mucous membranes moist NECK: supple w/o LA CV: rrr. PULM: ctab, no inc wob ABD: soft, +bs EXT: no edema SKIN: no acute rash  Diabetic foot exam: Normal inspection No skin breakdown No calluses  Normal DP pulses Normal sensation to light touch and monofilament Nails normal

## 2017-02-25 NOTE — Patient Instructions (Addendum)
Go to the lab on the way out.  We'll contact you with your lab report. Take care.  Glad to see you.  Keep exercising.   Don't change your meds for now.  We should recheck your A1c mid summer 2019.

## 2017-02-28 DIAGNOSIS — G4733 Obstructive sleep apnea (adult) (pediatric): Secondary | ICD-10-CM | POA: Insufficient documentation

## 2017-02-28 NOTE — Assessment & Plan Note (Signed)
Continue CPAP, see above.

## 2017-02-28 NOTE — Assessment & Plan Note (Signed)
Advance directive- living will done by patient. Wife would be designated if incapacitated.

## 2017-02-28 NOTE — Assessment & Plan Note (Signed)
Recheck later this year.  Continue work on diet and exercise.

## 2017-02-28 NOTE — Assessment & Plan Note (Signed)
No ADE on med. Continue as is for now, see notes on labs.

## 2017-02-28 NOTE — Assessment & Plan Note (Signed)
Flu shot declined.   Shingles 2013 PNA 2014  Tetanus d/w pt. would be cheaper at the pharmacy, d/w pt.   Colonoscopy 2017.   Prostate cancer screening and PSA options(with potential risks and benefits of testing vs not testing) were discussed along with recent recs/guidelines. He declined testing PSAat this point. Advance directive- living will done by patient. Wife would be designated if incapacitated.  Cognitive function addressed- see scanned forms- and if abnormal then additional documentation follows.

## 2017-02-28 NOTE — Assessment & Plan Note (Signed)
Much improved, see above.

## 2017-03-04 ENCOUNTER — Encounter: Payer: Medicare Other | Admitting: Family Medicine

## 2017-03-20 DIAGNOSIS — C44319 Basal cell carcinoma of skin of other parts of face: Secondary | ICD-10-CM | POA: Diagnosis not present

## 2017-04-18 DIAGNOSIS — E7800 Pure hypercholesterolemia, unspecified: Secondary | ICD-10-CM | POA: Diagnosis not present

## 2017-04-18 DIAGNOSIS — Z85828 Personal history of other malignant neoplasm of skin: Secondary | ICD-10-CM | POA: Diagnosis not present

## 2017-04-18 DIAGNOSIS — G4733 Obstructive sleep apnea (adult) (pediatric): Secondary | ICD-10-CM | POA: Diagnosis not present

## 2017-05-01 DIAGNOSIS — Z85828 Personal history of other malignant neoplasm of skin: Secondary | ICD-10-CM | POA: Diagnosis not present

## 2017-05-01 DIAGNOSIS — L821 Other seborrheic keratosis: Secondary | ICD-10-CM | POA: Diagnosis not present

## 2017-05-01 DIAGNOSIS — L814 Other melanin hyperpigmentation: Secondary | ICD-10-CM | POA: Diagnosis not present

## 2017-05-01 DIAGNOSIS — Z85820 Personal history of malignant melanoma of skin: Secondary | ICD-10-CM | POA: Diagnosis not present

## 2017-05-01 DIAGNOSIS — D225 Melanocytic nevi of trunk: Secondary | ICD-10-CM | POA: Diagnosis not present

## 2017-05-22 DIAGNOSIS — M545 Low back pain: Secondary | ICD-10-CM | POA: Diagnosis not present

## 2017-05-22 DIAGNOSIS — M9905 Segmental and somatic dysfunction of pelvic region: Secondary | ICD-10-CM | POA: Diagnosis not present

## 2017-05-22 DIAGNOSIS — M25551 Pain in right hip: Secondary | ICD-10-CM | POA: Diagnosis not present

## 2017-05-22 DIAGNOSIS — M9903 Segmental and somatic dysfunction of lumbar region: Secondary | ICD-10-CM | POA: Diagnosis not present

## 2017-05-24 ENCOUNTER — Ambulatory Visit (INDEPENDENT_AMBULATORY_CARE_PROVIDER_SITE_OTHER): Payer: Medicare Other | Admitting: Family Medicine

## 2017-05-24 ENCOUNTER — Other Ambulatory Visit (INDEPENDENT_AMBULATORY_CARE_PROVIDER_SITE_OTHER): Payer: Medicare Other

## 2017-05-24 ENCOUNTER — Encounter: Payer: Self-pay | Admitting: Family Medicine

## 2017-05-24 VITALS — BP 130/66 | HR 69 | Temp 98.8°F | Ht 66.0 in | Wt 181.2 lb

## 2017-05-24 DIAGNOSIS — E119 Type 2 diabetes mellitus without complications: Secondary | ICD-10-CM

## 2017-05-24 DIAGNOSIS — M5430 Sciatica, unspecified side: Secondary | ICD-10-CM

## 2017-05-24 LAB — MICROALBUMIN / CREATININE URINE RATIO
Creatinine,U: 135.5 mg/dL
MICROALB UR: 1.5 mg/dL (ref 0.0–1.9)
Microalb Creat Ratio: 1.1 mg/g (ref 0.0–30.0)

## 2017-05-24 LAB — HEMOGLOBIN A1C: Hgb A1c MFr Bld: 6.9 % — ABNORMAL HIGH (ref 4.6–6.5)

## 2017-05-24 NOTE — Progress Notes (Addendum)
He has seen chiropractor about his L leg pain.  Still with pain going down the leg, below the knee, down the L foot and toes.  Has been going on intermittently, recurrently, for months, always on the L side.  No R sided sx.  This AM was okay, but he is still having flares of sx.  Sleeping with pillow between his legs.  Had prev used prednisone, recently used advil.    He had xrays done at chiropractor clinic.    She had skin cancer resection on L side of his face per derm.    No FCNAVD.  No systemic illness o/w.    DM2. Labs pending.  See notes on labs.    Meds, vitals, and allergies reviewed.   ROS: Per HPI unless specifically indicated in ROS section   GEN: nad, alert and oriented HEENT: mucous membranes moist NECK: supple w/o LA CV: rrr.  no murmur PULM: ctab, no inc wob ABD: soft, +bs EXT: no edema SKIN: no acute rash S/S wnl BLE with SLR neg L leg at OV. Able to bear weight.

## 2017-05-24 NOTE — Patient Instructions (Signed)
We will call about your referral.  Rosaria Ferries or Azalee Course will call you if you don't see one of them on the way out.  Take care.  Glad to see you.  Okay to take motrin/advil with food.

## 2017-05-26 NOTE — Assessment & Plan Note (Signed)
He does not have symptoms at this moment but he continues having episodic recurrent stereotypical flares of pain radiating down the left leg without weakness, going on for months.  At this point still okay for outpatient follow-up.  He is failed conservative treatment with previous prednisone use.  He has seen the chiropractor previously.  Reasonable to get an MRI done.  This can be done as outpatient.  See notes on imaging when resulted.  Update me as needed.  See after visit summary.  He agrees.

## 2017-05-26 NOTE — Assessment & Plan Note (Signed)
See notes on labs. 

## 2017-05-28 ENCOUNTER — Ambulatory Visit
Admission: RE | Admit: 2017-05-28 | Discharge: 2017-05-28 | Disposition: A | Discharge status: Home / Self Care | Payer: Medicare Other | Source: Ambulatory Visit | Attending: Family Medicine | Admitting: Family Medicine

## 2017-05-28 DIAGNOSIS — M5430 Sciatica, unspecified side: Secondary | ICD-10-CM

## 2017-05-28 DIAGNOSIS — M5416 Radiculopathy, lumbar region: Secondary | ICD-10-CM | POA: Diagnosis not present

## 2017-05-30 ENCOUNTER — Telehealth: Payer: Self-pay | Admitting: Family Medicine

## 2017-05-30 ENCOUNTER — Other Ambulatory Visit: Payer: Self-pay | Admitting: Family Medicine

## 2017-05-30 DIAGNOSIS — M5430 Sciatica, unspecified side: Secondary | ICD-10-CM

## 2017-05-30 NOTE — Telephone Encounter (Signed)
Copied from [place] [id number]. Topic: Inquiry >> May 30, 2017 11:28 AM Margot Ables wrote: Reason for CRM: pt is requesting MRI be sent to his chiropractor. He has an appointment Monday 06/03/17 8:00am. He is requesting call back from [place] to notify. Jola Baptist, [facility]  Phone: [phone]; Fax: [phone]

## 2017-05-30 NOTE — Telephone Encounter (Signed)
Spoke with patient, see referral message note-Roger [name], RMA

## 2017-05-30 NOTE — Telephone Encounter (Signed)
I spoke with pt; pt wants to pick up actual MRI and take that with him to the appt with chiropractor; I spoke with Robin at [facility] and pt spoke with Shirlean Mylar and pt is to pick up MRI later today. Pt also wants to know about neurosurgery appt. Pt wants to know where would go in [place] to see neurosurgeon. Anastasiya will call pt back. Pt voiced understanding.

## 2017-06-03 ENCOUNTER — Other Ambulatory Visit: Payer: Medicare Other

## 2017-06-03 DIAGNOSIS — M9903 Segmental and somatic dysfunction of lumbar region: Secondary | ICD-10-CM | POA: Diagnosis not present

## 2017-06-03 DIAGNOSIS — M25551 Pain in right hip: Secondary | ICD-10-CM | POA: Diagnosis not present

## 2017-06-03 DIAGNOSIS — M545 Low back pain: Secondary | ICD-10-CM | POA: Diagnosis not present

## 2017-06-03 DIAGNOSIS — M9905 Segmental and somatic dysfunction of pelvic region: Secondary | ICD-10-CM | POA: Diagnosis not present

## 2017-06-07 ENCOUNTER — Other Ambulatory Visit: Payer: Medicare Other

## 2017-06-10 ENCOUNTER — Other Ambulatory Visit: Payer: Self-pay | Admitting: Neurological Surgery

## 2017-06-10 DIAGNOSIS — R030 Elevated blood-pressure reading, without diagnosis of hypertension: Secondary | ICD-10-CM | POA: Diagnosis not present

## 2017-06-10 DIAGNOSIS — M5416 Radiculopathy, lumbar region: Secondary | ICD-10-CM | POA: Diagnosis not present

## 2017-06-10 DIAGNOSIS — Z6829 Body mass index (BMI) 29.0-29.9, adult: Secondary | ICD-10-CM | POA: Diagnosis not present

## 2017-06-10 DIAGNOSIS — M4316 Spondylolisthesis, lumbar region: Secondary | ICD-10-CM | POA: Diagnosis not present

## 2017-06-10 DIAGNOSIS — M7130 Other bursal cyst, unspecified site: Secondary | ICD-10-CM | POA: Diagnosis not present

## 2017-06-26 ENCOUNTER — Other Ambulatory Visit: Payer: Self-pay | Admitting: Family Medicine

## 2017-06-26 NOTE — Telephone Encounter (Signed)
Copied from [place] [id number]. Topic: Quick Communication - Rx Refill/Question >> Jun 26, 2017  9:08 AM Synthia Innocent wrote: Medication: pravastatin (PRAVACHOL) 20 MG tablet   Has the patient contacted their pharmacy? Yes, early refill, [place] medicine [place] (Agent: If no, request that the patient contact the pharmacy for the refill.) (Agent: If yes, when and what did the pharmacy advise?)  Preferred Pharmacy (with phone number or street name): [facility] on [address]  Agent: Please be advised that RX refills may take up to 3 business days. We ask that you follow-up with your pharmacy.  Requesting to be called once sent

## 2017-06-27 ENCOUNTER — Other Ambulatory Visit: Payer: Self-pay

## 2017-06-27 MED ORDER — PRAVASTATIN SODIUM 20 MG PO TABS: 20.0000 mg | ORAL_TABLET | Freq: Every day | ORAL | 2 refills | Status: DC

## 2017-06-27 MED ORDER — PRAVASTATIN SODIUM 20 MG PO TABS: 20.0000 mg | ORAL_TABLET | Freq: Every day | ORAL | 3 refills | Status: DC

## 2017-06-27 NOTE — Telephone Encounter (Signed)
Patient's Pravastatin (PRAVACHOL) 20mg  tablet prescription was sent to [facility] in [facility], but it should have been sent to [facility] on [address] in [place].  Please resend to the correct pharmacy.

## 2017-06-27 NOTE — Addendum Note (Signed)
Addended by: Helene Shoe on: 06/27/2017 10:38 AM   Modules accepted: Orders

## 2017-06-27 NOTE — Telephone Encounter (Signed)
LOV 05/24/17 Dr. Damita Dunnings Last refill 01/03/17  # 60 with 2 refills

## 2017-07-04 ENCOUNTER — Other Ambulatory Visit (HOSPITAL_COMMUNITY): Payer: Medicare Other

## 2017-07-11 ENCOUNTER — Inpatient Hospital Stay: Admit: 2017-07-11 | Payer: Medicare Other | Admitting: Neurological Surgery

## 2017-07-11 SURGERY — LUMBAR LAMINECTOMY/DECOMPRESSION MICRODISCECTOMY 1 LEVEL
Anesthesia: General | Site: Back

## 2017-08-15 DIAGNOSIS — L905 Scar conditions and fibrosis of skin: Secondary | ICD-10-CM | POA: Diagnosis not present

## 2017-08-15 DIAGNOSIS — D485 Neoplasm of uncertain behavior of skin: Secondary | ICD-10-CM | POA: Diagnosis not present

## 2017-08-16 DIAGNOSIS — C44311 Basal cell carcinoma of skin of nose: Secondary | ICD-10-CM | POA: Diagnosis not present

## 2017-08-19 DIAGNOSIS — H5203 Hypermetropia, bilateral: Secondary | ICD-10-CM | POA: Diagnosis not present

## 2017-08-19 DIAGNOSIS — H353132 Nonexudative age-related macular degeneration, bilateral, intermediate dry stage: Secondary | ICD-10-CM | POA: Diagnosis not present

## 2017-08-19 DIAGNOSIS — H524 Presbyopia: Secondary | ICD-10-CM | POA: Diagnosis not present

## 2017-08-19 DIAGNOSIS — H52223 Regular astigmatism, bilateral: Secondary | ICD-10-CM | POA: Diagnosis not present

## 2017-08-19 DIAGNOSIS — H35363 Drusen (degenerative) of macula, bilateral: Secondary | ICD-10-CM | POA: Diagnosis not present

## 2017-08-19 DIAGNOSIS — H2513 Age-related nuclear cataract, bilateral: Secondary | ICD-10-CM | POA: Diagnosis not present

## 2017-08-19 LAB — HM DIABETES EYE EXAM

## 2017-10-30 DIAGNOSIS — C44311 Basal cell carcinoma of skin of nose: Secondary | ICD-10-CM | POA: Diagnosis not present

## 2017-11-26 ENCOUNTER — Ambulatory Visit (INDEPENDENT_AMBULATORY_CARE_PROVIDER_SITE_OTHER): Payer: Medicare Other | Admitting: Family Medicine

## 2017-11-26 ENCOUNTER — Encounter: Payer: Self-pay | Admitting: Family Medicine

## 2017-11-26 VITALS — BP 132/72 | HR 67 | Temp 99.1°F | Ht 66.0 in | Wt 181.8 lb

## 2017-11-26 DIAGNOSIS — Z23 Encounter for immunization: Secondary | ICD-10-CM | POA: Diagnosis not present

## 2017-11-26 DIAGNOSIS — E119 Type 2 diabetes mellitus without complications: Secondary | ICD-10-CM | POA: Diagnosis not present

## 2017-11-26 LAB — POCT GLYCOSYLATED HEMOGLOBIN (HGB A1C): HEMOGLOBIN A1C: 7 % — AB (ref 4.0–5.6)

## 2017-11-26 NOTE — Patient Instructions (Signed)
Thank you for your effort.  Recheck in about 6 months at a yearly visit.  Flu shot today.   Update me as needed.  Take care.  Glad to see you.  I hope you have a great trip to [place].

## 2017-11-26 NOTE — Progress Notes (Signed)
Diabetes:  No meds.  Hypoglycemic episodes:no Hyperglycemic episodes:no Feet problems: no Blood Sugars averaging: not checked at home.   eye exam within last year: 08/19/17.  No retinopathy per patient report.   He is walking for exercise.  A1c d/w pt, stable.    He is going to [facility] for the winter.   He had skin cancer removed per derm.    Meds, vitals, and allergies reviewed.   ROS: Per HPI unless specifically indicated in ROS section   GEN: nad, alert and oriented HEENT: mucous membranes moist NECK: supple w/o LA CV: rrr. PULM: ctab, no inc wob ABD: soft, +bs EXT: no edema SKIN: no acute rash  Diabetic foot exam: Normal inspection No skin breakdown No calluses  Normal DP pulses Normal sensation to light touch and monofilament Nails normal

## 2017-11-27 NOTE — Assessment & Plan Note (Signed)
Diet controlled.  Continue statin.  Continue work on diet and exercise.  No change in meds at this point.  Update me as needed.  He agrees.

## 2017-12-05 ENCOUNTER — Encounter: Payer: Self-pay | Admitting: Family Medicine

## 2018-01-06 DIAGNOSIS — D229 Melanocytic nevi, unspecified: Secondary | ICD-10-CM | POA: Diagnosis not present

## 2018-01-06 DIAGNOSIS — Z85828 Personal history of other malignant neoplasm of skin: Secondary | ICD-10-CM | POA: Diagnosis not present

## 2018-01-06 DIAGNOSIS — L821 Other seborrheic keratosis: Secondary | ICD-10-CM | POA: Diagnosis not present

## 2018-01-06 DIAGNOSIS — L819 Disorder of pigmentation, unspecified: Secondary | ICD-10-CM | POA: Diagnosis not present

## 2018-01-06 DIAGNOSIS — L570 Actinic keratosis: Secondary | ICD-10-CM | POA: Diagnosis not present

## 2018-01-06 DIAGNOSIS — Z85820 Personal history of malignant melanoma of skin: Secondary | ICD-10-CM | POA: Diagnosis not present

## 2018-04-01 ENCOUNTER — Ambulatory Visit: Payer: Medicare Other

## 2018-04-07 ENCOUNTER — Encounter: Payer: Medicare Other | Admitting: Family Medicine

## 2018-04-14 ENCOUNTER — Encounter: Payer: Medicare Other | Admitting: Family Medicine

## 2018-04-28 ENCOUNTER — Encounter: Payer: Medicare Other | Admitting: Family Medicine

## 2018-05-27 ENCOUNTER — Ambulatory Visit: Payer: Medicare Other | Admitting: Family Medicine

## 2018-06-02 ENCOUNTER — Encounter: Payer: Medicare Other | Admitting: Family Medicine

## 2018-06-03 DIAGNOSIS — G4733 Obstructive sleep apnea (adult) (pediatric): Secondary | ICD-10-CM | POA: Diagnosis not present

## 2018-06-03 DIAGNOSIS — C4492 Squamous cell carcinoma of skin, unspecified: Secondary | ICD-10-CM | POA: Diagnosis not present

## 2018-06-03 DIAGNOSIS — E7800 Pure hypercholesterolemia, unspecified: Secondary | ICD-10-CM | POA: Diagnosis not present

## 2018-06-26 ENCOUNTER — Other Ambulatory Visit: Payer: Self-pay

## 2018-06-26 ENCOUNTER — Encounter: Payer: Self-pay | Admitting: Family Medicine

## 2018-06-26 ENCOUNTER — Ambulatory Visit (INDEPENDENT_AMBULATORY_CARE_PROVIDER_SITE_OTHER): Payer: Medicare Other | Admitting: Family Medicine

## 2018-06-26 VITALS — BP 142/70 | HR 85 | Temp 98.2°F | Ht 66.0 in | Wt 179.6 lb

## 2018-06-26 DIAGNOSIS — E119 Type 2 diabetes mellitus without complications: Secondary | ICD-10-CM

## 2018-06-26 DIAGNOSIS — E785 Hyperlipidemia, unspecified: Secondary | ICD-10-CM

## 2018-06-26 DIAGNOSIS — Z0000 Encounter for general adult medical examination without abnormal findings: Secondary | ICD-10-CM

## 2018-06-26 DIAGNOSIS — Z7189 Other specified counseling: Secondary | ICD-10-CM

## 2018-06-26 LAB — COMPREHENSIVE METABOLIC PANEL
ALT: 20 U/L (ref 0–53)
AST: 16 U/L (ref 0–37)
Albumin: 4.4 g/dL (ref 3.5–5.2)
Alkaline Phosphatase: 49 U/L (ref 39–117)
BUN: 12 mg/dL (ref 6–23)
CO2: 29 mEq/L (ref 19–32)
Calcium: 9.3 mg/dL (ref 8.4–10.5)
Chloride: 102 mEq/L (ref 96–112)
Creatinine, Ser: 0.56 mg/dL (ref 0.40–1.50)
GFR: 141.83 mL/min (ref >60.00)
Glucose, Bld: 183 mg/dL — ABNORMAL HIGH (ref 70–99)
Potassium: 4.6 mEq/L (ref 3.5–5.1)
Sodium: 139 mEq/L (ref 135–145)
Total Bilirubin: 0.8 mg/dL (ref 0.2–1.2)
Total Protein: 7.1 g/dL (ref 6.0–8.3)

## 2018-06-26 LAB — LIPID PANEL
Cholesterol: 170 mg/dL (ref 0–200)
HDL: 51.6 mg/dL (ref >39.00)
LDL Cholesterol: 109 mg/dL — ABNORMAL HIGH (ref 0–99)
NonHDL: 117.94
Total CHOL/HDL Ratio: 3
Triglycerides: 45 mg/dL (ref 0.0–149.0)
VLDL: 9 mg/dL (ref 0.0–40.0)

## 2018-06-26 LAB — MICROALBUMIN / CREATININE URINE RATIO
Creatinine,U: 112.2 mg/dL
Microalb Creat Ratio: 1 mg/g (ref 0.0–30.0)
Microalb, Ur: 1.1 mg/dL (ref 0.0–1.9)

## 2018-06-26 LAB — HEMOGLOBIN A1C: Hgb A1c MFr Bld: 7.4 % — ABNORMAL HIGH (ref 4.6–6.5)

## 2018-06-26 MED ORDER — PRAVASTATIN SODIUM 20 MG PO TABS: 20.0000 mg | ORAL_TABLET | Freq: Every day | ORAL | 3 refills | Status: DC

## 2018-06-26 NOTE — Patient Instructions (Signed)
Don't change your meds for now, other than taking pravastatin at night.  Go to the lab on the way out.  We'll contact you with your lab report. Take care.  Glad to see you.  Stay well and stay safe.

## 2018-06-26 NOTE — Progress Notes (Signed)
I have personally reviewed the Medicare Annual Wellness questionnaire and have noted 1. The patient's medical and social history 2. Their use of alcohol, tobacco or illicit drugs 3. Their current medications and supplements 4. The patient's functional ability including ADL's, fall risks, home safety risks and hearing or visual             impairment. 5. Diet and physical activities 6. Evidence for depression or mood disorders  The patients weight, height, BMI have been recorded in the chart and visual acuity is per eye clinic.  I have made referrals, counseling and provided education to the patient based review of the above and I have provided the pt with a written personalized care plan for preventive services.  Provider list updated- see scanned forms.  Routine anticipatory guidance given to patient.  See health maintenance. The possibility exists that previously documented standard health maintenance information may have been brought forward from a previous encounter into this note.  If needed, that same information has been updated to reflect the current situation based on today's encounter.    Flu encouraged Shingles- Shingrix out of stock PNA up-to-date Tetanus discussed with patient, may be cheaper pharmacy.   Colonoscopy 2017 Prostate cancer screening and PSA options (with potential risks and benefits of testing vs not testing) were discussed along with recent recs/guidelines.  He declined testing PSA at this point. Advance directive-wife designated if patient were incapacitated. Cognitive function addressed- see scanned forms- and if abnormal then additional documentation follows.   He has f/u with derm routinely.    Pandemic d/w pt.    Elevated Cholesterol: Using medications without problems: yes Muscle aches: no Diet compliance:yes Exercise:yes Labs pending.   Diabetes:  No meds.   Hypoglycemic episodes:no Hyperglycemic episodes:no Feet problems: no Blood Sugars  averaging: not checked.  eye exam within last year: yes Labs pending.   PMH and SH reviewed  Meds, vitals, and allergies reviewed.   ROS: Per HPI.  Unless specifically indicated otherwise in HPI, the patient denies:  General: fever. Eyes: acute vision changes ENT: sore throat Cardiovascular: chest pain Respiratory: SOB GI: vomiting GU: dysuria Musculoskeletal: acute back pain Derm: acute rash Neuro: acute motor dysfunction Psych: worsening mood Endocrine: polydipsia Heme: bleeding Allergy: hayfever  GEN: nad, alert and oriented HEENT: mucous membranes moist NECK: supple w/o LA CV: rrr. PULM: ctab, no inc wob ABD: soft, +bs EXT: no edema SKIN: no acute rash  Diabetic foot exam: Normal inspection No skin breakdown No calluses  Normal DP pulses Normal sensation to light touch and monofilament Nails normal

## 2018-06-30 NOTE — Assessment & Plan Note (Signed)
See notes on labs.  Continue work on diet and exercise.  No change in meds at this point.  See after visit summary.

## 2018-06-30 NOTE — Assessment & Plan Note (Signed)
Advance directive- wife designated if patient were incapacitated.  

## 2018-06-30 NOTE — Assessment & Plan Note (Signed)
See notes on labs.  Continue work on diet and exercise.  No change in meds at this point. 

## 2018-06-30 NOTE — Assessment & Plan Note (Signed)
Flu encouraged Shingles- Shingrix out of stock PNA up-to-date Tetanus discussed with patient, may be cheaper pharmacy.   Colonoscopy 2017 Prostate cancer screening and PSA options (with potential risks and benefits of testing vs not testing) were discussed along with recent recs/guidelines.  He declined testing PSA at this point. Advance directive-wife designated if patient were incapacitated. Cognitive function addressed- see scanned forms- and if abnormal then additional documentation follows.

## 2018-07-07 DIAGNOSIS — H353132 Nonexudative age-related macular degeneration, bilateral, intermediate dry stage: Secondary | ICD-10-CM | POA: Diagnosis not present

## 2018-07-08 DIAGNOSIS — L218 Other seborrheic dermatitis: Secondary | ICD-10-CM | POA: Diagnosis not present

## 2018-07-08 DIAGNOSIS — L821 Other seborrheic keratosis: Secondary | ICD-10-CM | POA: Diagnosis not present

## 2018-07-08 DIAGNOSIS — Z85820 Personal history of malignant melanoma of skin: Secondary | ICD-10-CM | POA: Diagnosis not present

## 2018-07-08 DIAGNOSIS — L814 Other melanin hyperpigmentation: Secondary | ICD-10-CM | POA: Diagnosis not present

## 2018-07-08 DIAGNOSIS — L819 Disorder of pigmentation, unspecified: Secondary | ICD-10-CM | POA: Diagnosis not present

## 2018-07-08 DIAGNOSIS — D229 Melanocytic nevi, unspecified: Secondary | ICD-10-CM | POA: Diagnosis not present

## 2018-07-22 ENCOUNTER — Encounter: Payer: Medicare Other | Admitting: Family Medicine

## 2018-12-29 DIAGNOSIS — L821 Other seborrheic keratosis: Secondary | ICD-10-CM | POA: Diagnosis not present

## 2018-12-29 DIAGNOSIS — D485 Neoplasm of uncertain behavior of skin: Secondary | ICD-10-CM | POA: Diagnosis not present

## 2018-12-29 DIAGNOSIS — D1801 Hemangioma of skin and subcutaneous tissue: Secondary | ICD-10-CM | POA: Diagnosis not present

## 2018-12-29 DIAGNOSIS — Z85820 Personal history of malignant melanoma of skin: Secondary | ICD-10-CM | POA: Diagnosis not present

## 2018-12-29 DIAGNOSIS — D225 Melanocytic nevi of trunk: Secondary | ICD-10-CM | POA: Diagnosis not present

## 2018-12-29 DIAGNOSIS — Z85828 Personal history of other malignant neoplasm of skin: Secondary | ICD-10-CM | POA: Diagnosis not present

## 2019-01-05 ENCOUNTER — Ambulatory Visit (INDEPENDENT_AMBULATORY_CARE_PROVIDER_SITE_OTHER): Payer: Medicare Other | Admitting: Family Medicine

## 2019-01-05 ENCOUNTER — Other Ambulatory Visit: Payer: Self-pay

## 2019-01-05 ENCOUNTER — Encounter: Payer: Self-pay | Admitting: Family Medicine

## 2019-01-05 VITALS — BP 132/68 | HR 78 | Temp 97.6°F | Ht 66.0 in | Wt 178.1 lb

## 2019-01-05 DIAGNOSIS — E119 Type 2 diabetes mellitus without complications: Secondary | ICD-10-CM | POA: Diagnosis not present

## 2019-01-05 DIAGNOSIS — Z23 Encounter for immunization: Secondary | ICD-10-CM

## 2019-01-05 LAB — POCT GLYCOSYLATED HEMOGLOBIN (HGB A1C): Hemoglobin A1C: 7.8 % — AB (ref 4.0–5.6)

## 2019-01-05 NOTE — Assessment & Plan Note (Addendum)
A1c 7.8.  D/w pt.  Slightly higher than prev.  He hasn't been walking as much recently and he is going to work on that.     Recheck in 3 months.  He can likely get A1c lower w/o meds.

## 2019-01-05 NOTE — Patient Instructions (Addendum)
Recheck in about 3 months like today.  Cut out the "whites"- white bread, pasta, potatoes.   The only lab you need to have done for your next diabetic visit is an A1c.  We can do this with a fingerstick test at the office visit.  You do not need a lab visit ahead of time for this.  It does not matter if you are fasting when the lab is done.   Take care.  Glad to see you.

## 2019-01-05 NOTE — Progress Notes (Signed)
This visit occurred during the SARS-CoV-2 public health emergency.  Safety protocols were in place, including screening questions prior to the visit, additional usage of staff PPE, and extensive cleaning of exam room while observing appropriate contact time as indicated for disinfecting solutions.  Diabetes:  No meds.  Hypoglycemic episodes: no sx Hyperglycemic episodes: no sx Feet problems:no Blood Sugars averaging: not checked.   eye exam within last year: yes, 05/2018 A1c 7.8.  D/w pt.  Slightly higher than prev.  He hasn't been walking as much recently and he is going to work on that.     covid cautions d/w pt.  His wife has a pacemaker and mult health concerns.    He had f/u with Dr. Veleta Miners in the meantime with dermatology.  He is going to [place] soon.    Meds, vitals, and allergies reviewed.  ROS: Per HPI unless specifically indicated in ROS section   GEN: nad, alert and oriented HEENT: ncat NECK: supple w/o LA CV: rrr. PULM: ctab, no inc wob ABD: soft, +bs EXT: no edema SKIN: no acute rash

## 2019-03-30 ENCOUNTER — Other Ambulatory Visit: Payer: Self-pay

## 2019-03-30 ENCOUNTER — Ambulatory Visit (INDEPENDENT_AMBULATORY_CARE_PROVIDER_SITE_OTHER): Payer: Medicare Other | Admitting: Family Medicine

## 2019-03-30 ENCOUNTER — Encounter: Payer: Self-pay | Admitting: Family Medicine

## 2019-03-30 VITALS — BP 128/82 | HR 70 | Temp 97.0°F | Wt 177.0 lb

## 2019-03-30 DIAGNOSIS — E119 Type 2 diabetes mellitus without complications: Secondary | ICD-10-CM

## 2019-03-30 LAB — POCT GLYCOSYLATED HEMOGLOBIN (HGB A1C): Hemoglobin A1C: 6.9 % — AB (ref 4.0–5.6)

## 2019-03-30 NOTE — Patient Instructions (Addendum)
Your A1c is better.  Recheck in about 4 months at a yearly physical/medicare wellness visit.  Take care.  Glad to see you. Thanks for your effort.   [email or web address]

## 2019-03-30 NOTE — Progress Notes (Signed)
This visit occurred during the SARS-CoV-2 public health emergency.  Safety protocols were in place, including screening questions prior to the visit, additional usage of staff PPE, and extensive cleaning of exam room while observing appropriate contact time as indicated for disinfecting solutions.  Diabetes:  No meds.   Hypoglycemic episodes: no sx Hyperglycemic episodes: no sx Feet problems: no Blood Sugars averaging: not checked.   eye exam within last year: yes (done yearly), he'll have f/u in May of this year.   A1c improved, d/w pt at [place].  He has been working on his diet and walking a lot.   No ADE on statin, discussed rationale for use in patient with diabetes..    covid vaccine d/w pt.    Meds, vitals, and allergies reviewed.  ROS: Per HPI unless specifically indicated in ROS section   GEN: nad, alert and oriented HEENT: ncat NECK: supple w/o LA CV: rrr. PULM: ctab, no inc wob ABD: soft, +bs EXT: no edema SKIN: no acute rash but scabbed lesion on R calf after likely prev insect bite.    Diabetic foot exam: Normal inspection No skin breakdown No calluses  Normal DP pulses Normal sensation to light touch and monofilament Nails normal

## 2019-04-01 NOTE — Assessment & Plan Note (Signed)
A1c improved, d/w pt at [place].  He has been working on his diet and walking a lot.   No ADE on statin, discussed rationale for use in patient with diabetes..   I thanked him for his effort.  Recheck periodically.  He agrees.  I expect the lesion on his leg to heal well but if it does not then I want him to get that reevaluated.  He agrees.

## 2019-04-13 ENCOUNTER — Ambulatory Visit: Payer: Medicare Other | Attending: Internal Medicine

## 2019-04-13 DIAGNOSIS — Z23 Encounter for immunization: Secondary | ICD-10-CM

## 2019-04-13 NOTE — Progress Notes (Signed)
   [facility]  Name:  Roger [name]. Roger [name]    MRN: [id number] DOB: Feb 25, 1940  04/13/2019  Roger [name] was observed post Covid-19 immunization for 15 minutes without incident. He was provided with Vaccine Information Sheet and instruction to access the V-Safe system.   Roger [name] was instructed to call 911 with any severe reactions post vaccine: Marland Kitchen Difficulty breathing  . Swelling of face and throat  . A fast heartbeat  . A bad rash all over body  . Dizziness and weakness   Immunizations Administered    Name Date Dose VIS Date Route   Pfizer COVID-19 Vaccine 04/13/2019 10:00 AM 0.3 mL 01/02/2019 Intramuscular   Manufacturer: [place]   Lot: [id number]   [place]: [id number]

## 2019-05-06 ENCOUNTER — Ambulatory Visit: Payer: Medicare Other | Attending: Internal Medicine

## 2019-05-06 ENCOUNTER — Ambulatory Visit: Payer: Medicare Other

## 2019-05-06 DIAGNOSIS — L821 Other seborrheic keratosis: Secondary | ICD-10-CM | POA: Diagnosis not present

## 2019-05-06 DIAGNOSIS — D229 Melanocytic nevi, unspecified: Secondary | ICD-10-CM | POA: Diagnosis not present

## 2019-05-06 DIAGNOSIS — L819 Disorder of pigmentation, unspecified: Secondary | ICD-10-CM | POA: Diagnosis not present

## 2019-05-06 DIAGNOSIS — D1801 Hemangioma of skin and subcutaneous tissue: Secondary | ICD-10-CM | POA: Diagnosis not present

## 2019-05-06 DIAGNOSIS — Z23 Encounter for immunization: Secondary | ICD-10-CM

## 2019-05-06 DIAGNOSIS — L905 Scar conditions and fibrosis of skin: Secondary | ICD-10-CM | POA: Diagnosis not present

## 2019-05-06 DIAGNOSIS — Z85820 Personal history of malignant melanoma of skin: Secondary | ICD-10-CM | POA: Diagnosis not present

## 2019-05-06 NOTE — Progress Notes (Signed)
   [facility]  Name:  Daid Tresch. Cuffee    MRN: [id number] DOB: 1940-07-05  05/06/2019  Mr. Colunga was observed post Covid-19 immunization for 15 minutes without incident. He was provided with Vaccine Information Sheet and instruction to access the V-Safe system.   Mr. Shean was instructed to call 911 with any severe reactions post vaccine: Marland Kitchen Difficulty breathing  . Swelling of face and throat  . A fast heartbeat  . A bad rash all over body  . Dizziness and weakness   Immunizations Administered    Name Date Dose VIS Date Route   Pfizer COVID-19 Vaccine 05/06/2019  8:08 AM 0.3 mL 01/02/2019 Intramuscular   Manufacturer: [employer]   Lot: [id number]   [place]: [id number]

## 2019-07-13 DIAGNOSIS — H353132 Nonexudative age-related macular degeneration, bilateral, intermediate dry stage: Secondary | ICD-10-CM | POA: Diagnosis not present

## 2019-07-15 DIAGNOSIS — Z6827 Body mass index (BMI) 27.0-27.9, adult: Secondary | ICD-10-CM | POA: Diagnosis not present

## 2019-07-15 DIAGNOSIS — C4492 Squamous cell carcinoma of skin, unspecified: Secondary | ICD-10-CM | POA: Diagnosis not present

## 2019-07-15 DIAGNOSIS — E7800 Pure hypercholesterolemia, unspecified: Secondary | ICD-10-CM | POA: Diagnosis not present

## 2019-07-15 DIAGNOSIS — G4733 Obstructive sleep apnea (adult) (pediatric): Secondary | ICD-10-CM | POA: Diagnosis not present

## 2019-07-15 DIAGNOSIS — Z0130 Encounter for examination of blood pressure without abnormal findings: Secondary | ICD-10-CM | POA: Diagnosis not present

## 2019-07-30 ENCOUNTER — Ambulatory Visit (INDEPENDENT_AMBULATORY_CARE_PROVIDER_SITE_OTHER): Payer: Medicare Other

## 2019-07-30 DIAGNOSIS — Z0000 Encounter for general adult medical examination without abnormal findings: Secondary | ICD-10-CM | POA: Diagnosis not present

## 2019-07-30 NOTE — Progress Notes (Signed)
PCP notes:  Health Maintenance: Tdap- insurance/financial   Abnormal Screenings: none   Patient concerns: none   Nurse concerns: none   Next PCP appt.: 08/03/2019 @ 8:30 am

## 2019-07-30 NOTE — Progress Notes (Signed)
Subjective:   Roger [name] is a 79 y.o. male who presents for Medicare Annual/Subsequent preventive examination.  Review of Systems: N/A      I connected with the patient today by telephone and verified that I am speaking with the correct person using two identifiers. Location patient: home Location nurse: work Persons participating in the virtual visit: patient, Marine scientist.   I discussed the limitations, risks, security and privacy concerns of performing an evaluation and management service by telephone and the availability of in person appointments. I also discussed with the patient that there may be a patient responsible charge related to this service. The patient expressed understanding and verbally consented to this telephonic visit.    Interactive audio and video telecommunications were attempted between this nurse and patient, however failed, due to patient having technical difficulties OR patient did not have access to video capability.  We continued and completed visit with audio only.     Cardiac Risk Factors include: advanced age (>60men, >54 women);diabetes mellitus;dyslipidemia;male gender     Objective:    Today's Vitals   There is no height or weight on file to calculate BMI.  Advanced Directives 07/30/2019 06/02/2015  Does Patient Have a Medical Advance Directive? No Yes  Type of Advance Directive - [place];Living will  Does patient want to make changes to medical advance directive? - No - Patient declined  Copy of [place] in Chart? - No - copy requested  Would patient like information on creating a medical advance directive? No - Patient declined -    Current Medications (verified) Outpatient Encounter Medications as of 07/30/2019  Medication Sig   Multiple Vitamins-Minerals (PRESERVISION AREDS 2+MULTI VIT PO) Take by mouth 2 (two) times daily.   pravastatin (PRAVACHOL) 20 MG tablet Take 1 tablet (20 mg total) by  mouth daily.   No facility-administered encounter medications on file as of 07/30/2019.    Allergies (verified) Patient has no known allergies.   History: Past Medical History:  Diagnosis Date   Acne    Dr. Allyson Sabal   Diabetes mellitus    h/o Type 2/no meds   Hyperlipidemia    Skin cancer    on back   Past Surgical History:  Procedure Laterality Date   APPENDECTOMY  7th grade   COLONOSCOPY     Melanoma Excision of back  1996   Dr. 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[employer])  Tobacco Use   Smoking status: Never Smoker   Smokeless tobacco: Never Used  Substance and Sexual Activity   Alcohol use: Yes    Alcohol/week: 0.0 standard drinks    Comment: rare   Drug use: No   Sexual activity: Not on file  Other Topics Concern   Not on file  Social History Narrative   Regular exercise - walking frequently   Married  37   Twin daughters, 3 grandkids and 9 great grandkids   Splits time between [place] and [employer]   Working Leisure centre manager   Social Determinants of Radio broadcast assistant Strain: Low Risk    Difficulty of Paying Living Expenses: Not hard at all  Food Insecurity: No Food Insecurity   Worried About Charity fundraiser in the Last Year: Never true   Arboriculturist in the Last Year: Never true  Transportation Needs: No Transportation Needs   Lack of Transportation (Medical): No   Lack of Transportation (Non-Medical): No  Physical Activity: Insufficiently Active   Days of Exercise per Week: 4 days    Minutes of Exercise per Session: 30 min  Stress: No Stress Concern Present   Feeling of Stress : Not at all  Social Connections:    Frequency of Communication with Friends and Family:    Frequency of Social Gatherings with Friends and Family:    Attends Religious Services:    Active Member of Clubs or Organizations:    Attends Music therapist:    Marital Status:     Tobacco Counseling Counseling given: Not Answered   Clinical Intake:  Pre-visit preparation completed: Yes  Pain : No/denies pain     Nutritional Risks: None Diabetes: Yes CBG done?: No Did pt. bring in CBG monitor from home?: No  How often do you need to have someone help you when you read instructions, pamphlets, or other written materials from your doctor or pharmacy?: 1 - Never What is the last grade level you completed in school?: 9th  Diabetic: Yes Nutrition Risk Assessment:  Has the patient had any N/V/D within the last 2 months?  No  Does the patient have any non-healing wounds?  No  Has the patient had any unintentional weight loss or weight gain?  No   Diabetes:  Is the patient diabetic?  Yes  If diabetic, was a CBG obtained today?  No  Did the patient bring in their glucometer from home?  No  How often do you monitor your CBG's? When needed.   Financial Strains and Diabetes Management:  Are you having any financial strains with the device, your supplies or your medication? No .  Does the patient want to be seen by Chronic Care Management for management of their diabetes?  No  Would the patient like to be referred to a Nutritionist or for Diabetic Management?  No   Diabetic Exams:  Diabetic Eye Exam: Completed 01/30/2019 Diabetic Foot Exam: Completed 03/30/2019   Interpreter Needed?: No  Information entered by :: CJohnson, LPN   Activities of Daily Living In your present state of health, do you have any difficulty performing the following activities: 07/30/2019    Hearing? Y  Comment wears hearing aids  Vision? N  Difficulty concentrating or making decisions? N  Walking or climbing stairs? N  Dressing or bathing? N  Doing errands, shopping? N  Preparing Food and eating ? N  Using the Toilet? N  In the past six months, have you accidently leaked urine? N  Do you have problems with loss of bowel control? N  Managing your Medications? N  Managing your Finances? N  Housekeeping or managing your Housekeeping? N  Some recent data might be hidden    Patient Care Team: Tonia Ghent, MD as PCP - General Jola Baptist, DC as Referring Physician (Chiropractic Medicine)  Indicate any recent Medical Services you may have received from  other than [facility] providers in the past year (date may be approximate).     Assessment:   This is a routine wellness examination for Erlin.  Hearing/Vision screen  Hearing Screening   125Hz  250Hz  500Hz  1000Hz  2000Hz  3000Hz  4000Hz  6000Hz  8000Hz   Right ear:           Left ear:           Vision Screening Comments: Patient gets annual eye exams  Dietary issues and exercise activities discussed: Current Exercise Habits: Home exercise routine, Type of exercise: walking, Time (Minutes): 30, Frequency (Times/Week): 4, Weekly Exercise (Minutes/Week): 120, Intensity: Mild, Exercise limited by: None identified  Goals     Patient Stated     07/30/2019, I will continue to walk 4 days a week for 30 minutes.       Depression Screen PHQ 2/9 Scores 07/30/2019 06/26/2018 02/25/2017 12/19/2015 12/13/2014 08/12/2014 09/15/2012  PHQ - 2 Score 0 0 0 0 0 0 0  PHQ- 9 Score 0 - - - - - -    Fall Risk Fall Risk  07/30/2019 06/26/2018 02/25/2017 12/30/2015 12/19/2015  Falls in the past year? 0 0 No No No  Comment - - - Emmi Telephone Survey: data to providers prior to load -  Number falls in past yr: 0 0 - - -  Injury with Fall? 0 - - - -  Risk for fall due to : No Fall Risks - - - -  Follow up Falls prevention discussed;Falls evaluation  completed - - - -    Any stairs in or around the home? Yes  If so, are there any without handrails? No  Home free of loose throw rugs in walkways, pet beds, electrical cords, etc? Yes  Adequate lighting in your home to reduce risk of falls? Yes   ASSISTIVE DEVICES UTILIZED TO PREVENT FALLS:  Life alert? No  Use of a cane, walker or w/c? No  Grab bars in the bathroom? No  Shower chair or bench in shower? No  Elevated toilet seat or a handicapped toilet? No   TIMED UP AND GO:  Was the test performed? N/A, telephonic visit.    Cognitive Function: MMSE - Mini Mental State Exam 07/30/2019  Not completed: Refused       Mini Cog  Mini-Cog screen was not completed. Patient refused, did not have time. Maximum score is 22. A value of 0 denotes this part of the MMSE was not completed or the patient failed this part of the Mini-Cog screening.  Immunizations Immunization History  Administered Date(s) Administered   Fluad Quad(high Dose 65+) 01/05/2019   Influenza Whole 10/04/2009   Influenza,inj,Quad PF,6+ Mos 10/06/2013, 12/13/2014, 12/19/2015, 11/26/2017   Influenza-Unspecified 10/07/2015   PFIZER SARS-COV-2 Vaccination 04/13/2019, 05/06/2019   Pneumococcal Conjugate-13 12/13/2014   Pneumococcal Polysaccharide-23 09/15/2012   Zoster 09/04/2011    TDAP status: Due, Education has been provided regarding the importance of this vaccine. Advised may receive this vaccine at local pharmacy or Health Dept. Aware to provide a copy of the vaccination record if obtained from local pharmacy or Health Dept. Verbalized acceptance and understanding. Flu Vaccine status: Up to date Pneumococcal vaccine status: Up to date Covid-19 vaccine status: Completed vaccines  Qualifies for Shingles Vaccine? Yes   Zostavax completed Yes   Shingrix Completed?: No.    Education has been provided regarding the importance of this vaccine. Patient has been advised to call insurance company to determine  out of pocket expense if they have not yet  received this vaccine. Advised may also receive vaccine at local pharmacy or Health Dept. Verbalized acceptance and understanding.  Screening Tests Health Maintenance  Topic Date Due   Hepatitis C Screening  Never done   URINE MICROALBUMIN  06/26/2019   TETANUS/TDAP  07/29/2024 (Originally 06/08/1959)   INFLUENZA VACCINE  08/23/2019   HEMOGLOBIN A1C  09/30/2019   OPHTHALMOLOGY EXAM  01/30/2020   FOOT EXAM  03/29/2020   COVID-19 Vaccine  Completed   PNA vac Low Risk Adult  Completed    Health Maintenance  Health Maintenance Due  Topic Date Due   Hepatitis C Screening  Never done   URINE MICROALBUMIN  06/26/2019    Colorectal cancer screening: Completed 06/15/2015. no longer required   Lung Cancer Screening: (Low Dose CT Chest recommended if Age 62-80 years, 30 pack-year currently smoking OR have quit w/in 15years.) does not qualify.     Additional Screening:  Hepatitis C Screening: does qualify; Completed due  Vision Screening: Recommended annual ophthalmology exams for early detection of glaucoma and other disorders of the eye. Is the patient up to date with their annual eye exam?  Yes  Who is the provider or what is the name of the office in which the patient attends annual eye exams? Dr. Heather [place] If pt is not established with a provider, would they like to be referred to a provider to establish care? No .   Dental Screening: Recommended annual dental exams for proper oral hygiene  Community Resource Referral / Chronic Care Management: CRR required this visit?  No   CCM required this visit?  No      Plan:     I have personally reviewed and noted the following in the patients chart:    Medical and social history  Use of alcohol, tobacco or illicit drugs   Current medications and supplements  Functional ability and status  Nutritional status  Physical activity  Advanced directives  List of  other physicians  Hospitalizations, surgeries, and ER visits in previous 12 months  Vitals  Screenings to include cognitive, depression, and falls  Referrals and appointments  In addition, I have reviewed and discussed with patient certain preventive protocols, quality metrics, and best practice recommendations. A written personalized care plan for preventive services as well as general preventive health recommendations were provided to patient.   Due to this being a telephonic visit, the after visit summary with patients personalized plan was offered to patient via mail or my-chart. Patient preferred to pick up at office at next visit.   Andrez Grime, LPN   09/30/2639

## 2019-07-30 NOTE — Patient Instructions (Signed)
Roger [name] , Thank you for taking time to come for your Medicare Wellness Visit. I appreciate your ongoing commitment to your health goals. Please review the following plan we discussed and let me know if I can assist you in the future.   Screening recommendations/referrals: Colonoscopy: no longer required Recommended yearly ophthalmology/optometry visit for glaucoma screening and checkup Recommended yearly dental visit for hygiene and checkup  Vaccinations: Influenza vaccine: Up to date, completed 01/05/2019, due 08/2019 Pneumococcal vaccine: Completed series Tdap vaccine: decline- insurance/financial Shingles vaccine: due, check with insurance for coverage   Covid-19: Completed series  Advanced directives: Advance directive discussed with you today. Even though you declined this today please call our office should you change your mind and we can give you the proper paperwork for you to fill out.   Conditions/risks identified: diabetes, hyperlipidemia  Next appointment: Follow up in one year for your annual wellness visit.   Preventive Care 77 Years and Older, Male Preventive care refers to lifestyle choices and visits with your health care provider that can promote health and wellness. What does preventive care include?  A yearly physical exam. This is also called an annual well check.  Dental exams once or twice a year.  Routine eye exams. Ask your health care provider how often you should have your eyes checked.  Personal lifestyle choices, including:  Daily care of your teeth and gums.  Regular physical activity.  Eating a healthy diet.  Avoiding tobacco and drug use.  Limiting alcohol use.  Practicing safe sex.  Taking low doses of aspirin every day.  Taking vitamin and mineral supplements as recommended by your health care provider. What happens during an annual well check? The services and screenings done by your health care provider during your annual  well check will depend on your age, overall health, lifestyle risk factors, and family history of disease. Counseling  Your health care provider may ask you questions about your:  Alcohol use.  Tobacco use.  Drug use.  Emotional well-being.  Home and relationship well-being.  Sexual activity.  Eating habits.  History of falls.  Memory and ability to understand (cognition).  Work and work Statistician. Screening  You may have the following tests or measurements:  Height, weight, and BMI.  Blood pressure.  Lipid and cholesterol levels. These may be checked every 5 years, or more frequently if you are over 10 years old.  Skin check.  Lung cancer screening. You may have this screening every year starting at age 43 if you have a 30-pack-year history of smoking and currently smoke or have quit within the past 15 years.  Fecal occult blood test (FOBT) of the stool. You may have this test every year starting at age 59.  Flexible sigmoidoscopy or colonoscopy. You may have a sigmoidoscopy every 5 years or a colonoscopy every 10 years starting at age 6.  Prostate cancer screening. Recommendations will vary depending on your family history and other risks.  Hepatitis C blood test.  Hepatitis B blood test.  Sexually transmitted disease (STD) testing.  Diabetes screening. This is done by checking your blood sugar (glucose) after you have not eaten for a while (fasting). You may have this done every 1-3 years.  Abdominal aortic aneurysm (AAA) screening. You may need this if you are a current or former smoker.  Osteoporosis. You may be screened starting at age 57 if you are at high risk. Talk with your health care provider about your test results, treatment options, and  if necessary, the need for more tests. Vaccines  Your health care provider may recommend certain vaccines, such as:  Influenza vaccine. This is recommended every year.  Tetanus, diphtheria, and acellular  pertussis (Tdap, Td) vaccine. You may need a Td booster every 10 years.  Zoster vaccine. You may need this after age 40.  Pneumococcal 13-valent conjugate (PCV13) vaccine. One dose is recommended after age 57.  Pneumococcal polysaccharide (PPSV23) vaccine. One dose is recommended after age 3. Talk to your health care provider about which screenings and vaccines you need and how often you need them. This information is not intended to replace advice given to you by your health care provider. Make sure you discuss any questions you have with your health care provider. Document Released: 02/04/2015 Document Revised: 09/28/2015 Document Reviewed: 11/09/2014 Elsevier Interactive Patient Education  2017 [place] Prevention in the Home Falls can cause injuries. They can happen to people of all ages. There are many things you can do to make your home safe and to help prevent falls. What can I do on the outside of my home?  Regularly fix the edges of walkways and driveways and fix any cracks.  Remove anything that might make you trip as you walk through a door, such as a raised step or threshold.  Trim any bushes or trees on the path to your home.  Use bright outdoor lighting.  Clear any walking paths of anything that might make someone trip, such as rocks or tools.  Regularly check to see if handrails are loose or broken. Make sure that both sides of any steps have handrails.  Any raised decks and porches should have guardrails on the edges.  Have any leaves, snow, or ice cleared regularly.  Use sand or salt on walking paths during winter.  Clean up any spills in your garage right away. This includes oil or grease spills. What can I do in the bathroom?  Use night lights.  Install grab bars by the toilet and in the tub and shower. Do not use towel bars as grab bars.  Use non-skid mats or decals in the tub or shower.  If you need to sit down in the shower, use a plastic,  non-slip stool.  Keep the floor dry. Clean up any water that spills on the floor as soon as it happens.  Remove soap buildup in the tub or shower regularly.  Attach bath mats securely with double-sided non-slip rug tape.  Do not have throw rugs and other things on the floor that can make you trip. What can I do in the bedroom?  Use night lights.  Make sure that you have a light by your bed that is easy to reach.  Do not use any sheets or blankets that are too big for your bed. They should not hang down onto the floor.  Have a firm chair that has side arms. You can use this for support while you get dressed.  Do not have throw rugs and other things on the floor that can make you trip. What can I do in the kitchen?  Clean up any spills right away.  Avoid walking on wet floors.  Keep items that you use a lot in easy-to-reach places.  If you need to reach something above you, use a strong step stool that has a grab bar.  Keep electrical cords out of the way.  Do not use floor polish or wax that makes floors slippery. If you  must use wax, use non-skid floor wax.  Do not have throw rugs and other things on the floor that can make you trip. What can I do with my stairs?  Do not leave any items on the stairs.  Make sure that there are handrails on both sides of the stairs and use them. Fix handrails that are broken or loose. Make sure that handrails are as long as the stairways.  Check any carpeting to make sure that it is firmly attached to the stairs. Fix any carpet that is loose or worn.  Avoid having throw rugs at the top or bottom of the stairs. If you do have throw rugs, attach them to the floor with carpet tape.  Make sure that you have a light switch at the top of the stairs and the bottom of the stairs. If you do not have them, ask someone to add them for you. What else can I do to help prevent falls?  Wear shoes that:  Do not have high heels.  Have rubber  bottoms.  Are comfortable and fit you well.  Are closed at the toe. Do not wear sandals.  If you use a stepladder:  Make sure that it is fully opened. Do not climb a closed stepladder.  Make sure that both sides of the stepladder are locked into place.  Ask someone to hold it for you, if possible.  Clearly mark and make sure that you can see:  Any grab bars or handrails.  First and last steps.  Where the edge of each step is.  Use tools that help you move around (mobility aids) if they are needed. These include:  Canes.  Walkers.  Scooters.  Crutches.  Turn on the lights when you go into a dark area. Replace any light bulbs as soon as they burn out.  Set up your furniture so you have a clear path. Avoid moving your furniture around.  If any of your floors are uneven, fix them.  If there are any pets around you, be aware of where they are.  Review your medicines with your doctor. Some medicines can make you feel dizzy. This can increase your chance of falling. Ask your doctor what other things that you can do to help prevent falls. This information is not intended to replace advice given to you by your health care provider. Make sure you discuss any questions you have with your health care provider. Document Released: 11/04/2008 Document Revised: 06/16/2015 Document Reviewed: 02/12/2014 Elsevier Interactive Patient Education  2017 [employer].

## 2019-08-03 ENCOUNTER — Ambulatory Visit: Payer: Medicare Other

## 2019-08-03 ENCOUNTER — Ambulatory Visit (INDEPENDENT_AMBULATORY_CARE_PROVIDER_SITE_OTHER): Payer: Medicare Other | Admitting: Family Medicine

## 2019-08-03 ENCOUNTER — Encounter: Payer: Self-pay | Admitting: Family Medicine

## 2019-08-03 ENCOUNTER — Other Ambulatory Visit: Payer: Self-pay

## 2019-08-03 VITALS — BP 124/72 | HR 69 | Temp 97.8°F | Ht 66.0 in | Wt 174.6 lb

## 2019-08-03 DIAGNOSIS — E119 Type 2 diabetes mellitus without complications: Secondary | ICD-10-CM

## 2019-08-03 DIAGNOSIS — E785 Hyperlipidemia, unspecified: Secondary | ICD-10-CM | POA: Diagnosis not present

## 2019-08-03 DIAGNOSIS — Z7189 Other specified counseling: Secondary | ICD-10-CM

## 2019-08-03 DIAGNOSIS — Z0000 Encounter for general adult medical examination without abnormal findings: Secondary | ICD-10-CM

## 2019-08-03 LAB — COMPREHENSIVE METABOLIC PANEL
ALT: 19 U/L (ref 0–53)
AST: 17 U/L (ref 0–37)
Albumin: 4.6 g/dL (ref 3.5–5.2)
Alkaline Phosphatase: 54 U/L (ref 39–117)
BUN: 14 mg/dL (ref 6–23)
CO2: 30 mEq/L (ref 19–32)
Calcium: 9.5 mg/dL (ref 8.4–10.5)
Chloride: 101 mEq/L (ref 96–112)
Creatinine, Ser: 0.53 mg/dL (ref 0.40–1.50)
GFR: 150.69 mL/min (ref >60.00)
Glucose, Bld: 169 mg/dL — ABNORMAL HIGH (ref 70–99)
Potassium: 4.4 mEq/L (ref 3.5–5.1)
Sodium: 137 mEq/L (ref 135–145)
Total Bilirubin: 0.9 mg/dL (ref 0.2–1.2)
Total Protein: 7.1 g/dL (ref 6.0–8.3)

## 2019-08-03 LAB — LIPID PANEL
Cholesterol: 126 mg/dL (ref 0–200)
HDL: 45.4 mg/dL (ref >39.00)
LDL Cholesterol: 68 mg/dL (ref 0–99)
NonHDL: 80.2
Total CHOL/HDL Ratio: 3
Triglycerides: 62 mg/dL (ref 0.0–149.0)
VLDL: 12.4 mg/dL (ref 0.0–40.0)

## 2019-08-03 LAB — MICROALBUMIN / CREATININE URINE RATIO
Creatinine,U: 99 mg/dL
Microalb Creat Ratio: 1 mg/g (ref 0.0–30.0)
Microalb, Ur: 1 mg/dL (ref 0.0–1.9)

## 2019-08-03 LAB — HEMOGLOBIN A1C: Hgb A1c MFr Bld: 6.9 % — ABNORMAL HIGH (ref 4.6–6.5)

## 2019-08-03 MED ORDER — PRAVASTATIN SODIUM 20 MG PO TABS: 20.0000 mg | ORAL_TABLET | Freq: Every day | ORAL | 3 refills | Status: DC

## 2019-08-03 NOTE — Progress Notes (Signed)
This visit occurred during the SARS-CoV-2 public health emergency.  Safety protocols were in place, including screening questions prior to the visit, additional usage of staff PPE, and extensive cleaning of exam room while observing appropriate contact time as indicated for disinfecting solutions.  Flu encouraged Shingles- Shingrix d/w pt.   PNA up-to-date Tetanus discussed with patient, may be cheaper pharmacy.   covid 2021 Colonoscopy 2017 Prostate cancer screening and PSA options (with potential risks and benefits of testing vs not testing) were discussed along with recent recs/guidelines.  He declined testing PSA at this point. Advance directive-wife designated if patient were incapacitated.  Hepatitis C screening discussed with patient.  He is low risk.  Reasonable to defer.  He has f/u with derm routinely.    Elevated Cholesterol: Using medications without problems: yes Muscle aches: no Diet compliance:yes Exercise:yes Labs pending.  Still on pravastatin.  Diabetes:  No meds.   Hypoglycemic episodes:no Hyperglycemic episodes:no Feet problems: no Blood Sugars averaging: not checked.  eye exam within last year: yes Labs pending.   Meds, vitals, and allergies reviewed.   ROS: Per HPI unless specifically indicated in ROS section   GEN: nad, alert and oriented HEENT: ncat NECK: supple w/o LA CV: rrr PULM: ctab, no inc wob ABD: soft, +bs EXT: no edema SKIN: no acute rash  Diabetic foot exam: Normal inspection No skin breakdown No calluses  Normal DP pulses Normal sensation to light touch and monofilament Nails normal

## 2019-08-03 NOTE — Patient Instructions (Addendum)
Check with your insurance to see if they will cover the shingrix and tetanus (Tdap) shots. I would get a flu shot each fall.    Don't change your meds for now.  Thanks for getting covid vaccine.  Thanks for your effort.   Go to the lab on the way out.   If you have mychart we'll likely use that to update you.   Take care.  Glad to see you.

## 2019-08-05 DIAGNOSIS — Z0000 Encounter for general adult medical examination without abnormal findings: Secondary | ICD-10-CM | POA: Insufficient documentation

## 2019-08-05 NOTE — Assessment & Plan Note (Signed)
Flu encouraged Shingles- Shingrix d/w pt.   PNA up-to-date Tetanus discussed with patient, may be cheaper pharmacy.   covid 2021 Colonoscopy 2017 Prostate cancer screening and PSA options (with potential risks and benefits of testing vs not testing) were discussed along with recent recs/guidelines.  He declined testing PSA at this point. Advance directive-wife designated if patient were incapacitated.

## 2019-08-05 NOTE — Assessment & Plan Note (Signed)
Advance directive- wife designated if patient were incapacitated.  

## 2019-08-05 NOTE — Assessment & Plan Note (Signed)
No medications currently.  Continue work on diet and exercise.  Continue statin.  See notes on labs.

## 2019-08-05 NOTE — Assessment & Plan Note (Signed)
Continue pravastatin.  Continue work on diet and exercise.  See notes on labs.

## 2019-08-10 ENCOUNTER — Ambulatory Visit: Payer: Medicare Other | Admitting: Family Medicine

## 2019-09-21 ENCOUNTER — Encounter: Payer: Self-pay | Admitting: Family Medicine

## 2019-09-21 ENCOUNTER — Ambulatory Visit (INDEPENDENT_AMBULATORY_CARE_PROVIDER_SITE_OTHER): Payer: Medicare Other | Admitting: Family Medicine

## 2019-09-21 ENCOUNTER — Other Ambulatory Visit: Payer: Self-pay

## 2019-09-21 VITALS — BP 150/70 | HR 72 | Temp 98.5°F | Ht 66.0 in | Wt 177.0 lb

## 2019-09-21 DIAGNOSIS — M549 Dorsalgia, unspecified: Secondary | ICD-10-CM | POA: Diagnosis not present

## 2019-09-21 DIAGNOSIS — S29012A Strain of muscle and tendon of back wall of thorax, initial encounter: Secondary | ICD-10-CM

## 2019-09-21 NOTE — Progress Notes (Signed)
Roger Esterly T. Roger Callaway, MD, [place] [facility] [name], [address]  Phone: [phone]  FAX: [place]. [place] - 79 y.o. male  MRN [id number]  Date of Birth: 1940/12/16  Date: 09/21/2019  PCP: Roger Ghent, MD  Referral: Roger Ghent, MD  Chief Complaint  Patient presents with  . Back Pain    Mid Right Side    This visit occurred during the SARS-CoV-2 public health emergency.  Safety protocols were in place, including screening questions prior to the visit, additional usage of staff PPE, and extensive cleaning of exam room while observing appropriate contact time as indicated for disinfecting solutions.   Subjective:   [name] is a 79 y.o. very pleasant male patient with Body mass index is 28.57 kg/m. who presents with the following:  Picked up a ten foot ladder.  He is quite healthy, but he is age 75.  Globally he does not have much by way of physical health problems, but he does not do much in the way of lifting or working out with weights.  Right when he was lifting this 10 foot ladder he did have some pain in the back.  He is not having any numbness or tingling.  It does not have any kind of significant prior back injury.  He is not having radicular symptoms.  Lab Results  Component Value Date   HGBA1C 6.9 (H) 08/03/2019    Well-controlled diabetes.  Wt Readings from Last 3 Encounters:  09/21/19 177 lb (80.3 kg)  08/03/19 174 lb 9.6 oz (79.2 kg)  03/30/19 177 lb (80.3 kg)    BP Readings from Last 3 Encounters:  09/21/19 (!) 150/70  08/03/19 124/72  03/30/19 128/82     Review of Systems is noted in the HPI, as appropriate   Objective:   BP (!) 150/70   Pulse 72   Temp 98.5 F (36.9 C) (Temporal)   Ht 5\' 6"  (1.676 m)   Wt 177 lb (80.3 kg)   SpO2 98%   BMI 28.57 kg/m    GEN: No acute distress;  alert,appropriate. PULM: Breathing comfortably in no respiratory distress PSYCH: Normally interactive.   Range of motion at  the waist: Flexion: normal Extension: normal Lateral bending: normal Rotation: all normal  No echymosis or edema Rises to examination table with no difficulty Gait: non antalgic  Inspection/Deformity: N Paraspinus Tenderness: Minimal, more on the right sided rhomboid region.  B Ankle Dorsiflexion (L5,4): 5/5 B Great Toe Dorsiflexion (L5,4): 5/5 Heel Walk (L5): WNL Toe Walk (S1): WNL Rise/Squat (L4): WNL  SENSORY B Medial Foot (L4): WNL B Dorsum (L5): WNL B Lateral (S1): WNL Light Touch: WNL Pinprick: WNL  REFLEXES Knee (L4): 2+ Ankle (S1): 2+  B SLR, seated: neg B Greater Troch: NT B Sciatic Notch: NT  Radiology: No results found.  Assessment and Plan:     ICD-10-CM   1. Rhomboid muscle strain, initial encounter  S29.012A   2. Acute back pain, unspecified back location, unspecified back pain laterality  M54.9    Typically these resolve on its own.  Tylenol or ibuprofen for pain as needed.  Reviewed some basic stretching, heat, massage, warm bath and shower would all be reasonable and appropriate.  Follow-up: No follow-ups on file.  No orders of the defined types were placed in this encounter.  There are no discontinued medications. No orders of the  defined types were placed in this encounter.   Signed,  Maud Deed. Mallie Giambra, MD   Outpatient Encounter Medications as of 09/21/2019  Medication Sig  . Multiple Vitamins-Minerals (PRESERVISION AREDS 2+MULTI VIT PO) Take by mouth 2 (two) times daily.  . pravastatin (PRAVACHOL) 20 MG tablet Take 1 tablet (20 mg total) by mouth daily.   No facility-administered encounter medications on file as of 09/21/2019.

## 2019-09-30 IMAGING — MR MR LUMBAR SPINE W/O CM
4 of 5 series · 20 of 48 positions shown · non-contrast
Comparison: None.

CLINICAL DATA: Five month history of low back pain with numbness in
the left leg. History of melanoma.

EXAM:
MRI LUMBAR SPINE WITHOUT CONTRAST
TECHNIQUE: Multiplanar, multisequence MR imaging of the lumbar spine was
performed. No intravenous contrast was administered.

[Series 6: T2 · sagittal · 4.0mm · 0.73mm/px · 6 of 16 slices shown (1 of 2)]
[im 1/16]
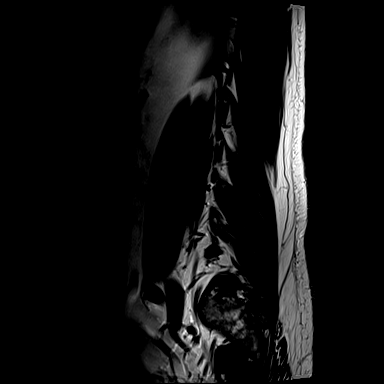
[im 4/16]
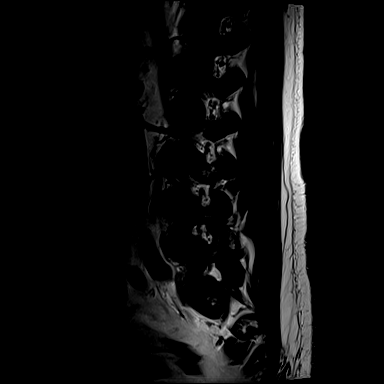
[im 7/16]
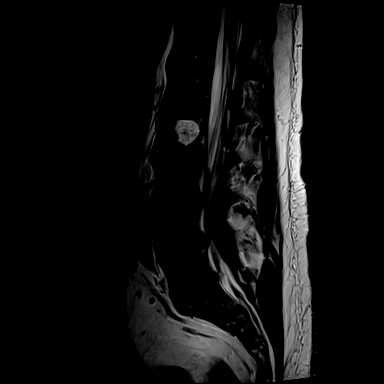
[im 10/16]
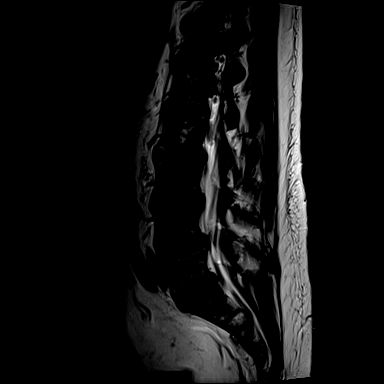
[im 13/16]
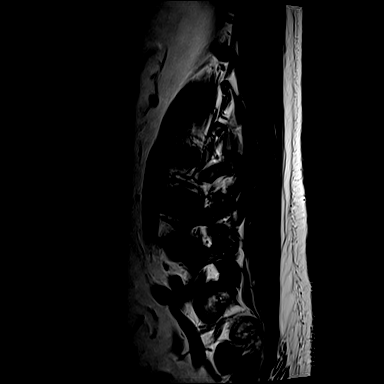
[im 16/16]
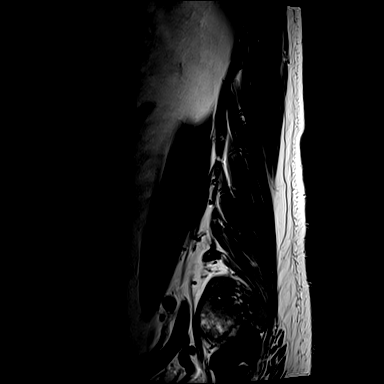

[Series 8: T1 · sagittal · 4.0mm · 0.73mm/px · 3 of 16 slices shown (1 of 2)]
[im 4/16]
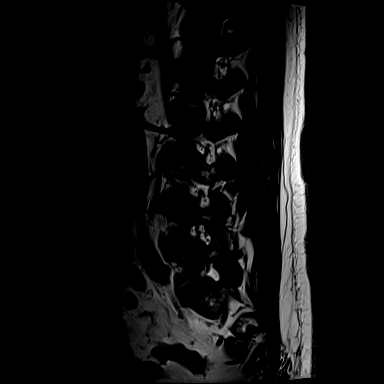
[im 10/16]
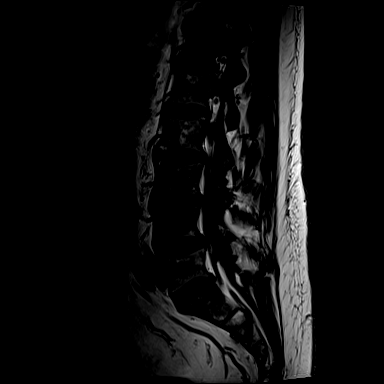
[im 16/16]
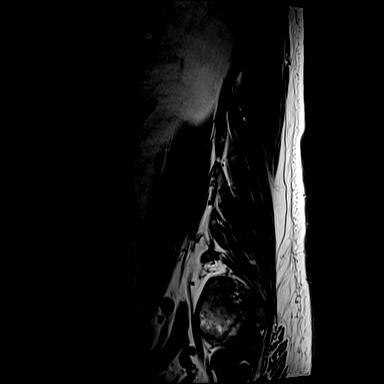

[Series 9: T2 · axial · 4.0mm · 0.28mm/px · z∈[-77,+88]mm · 8 of 37 slices shown (2 of 2)]
[im 1/37]
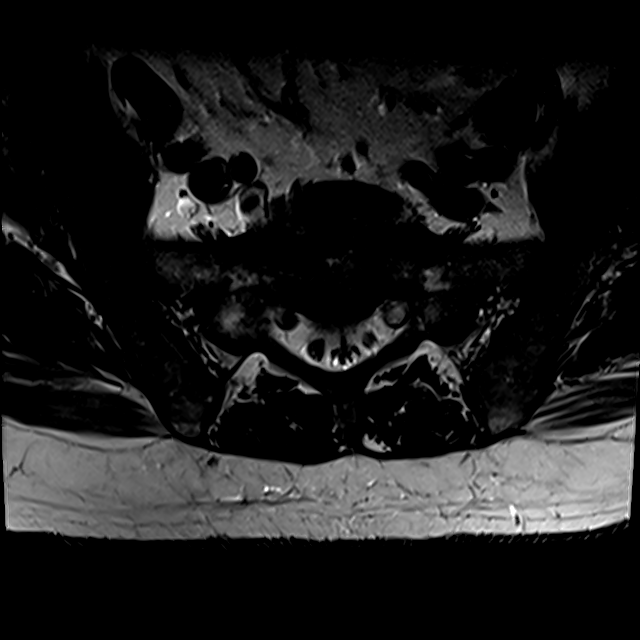
[im 6/37]
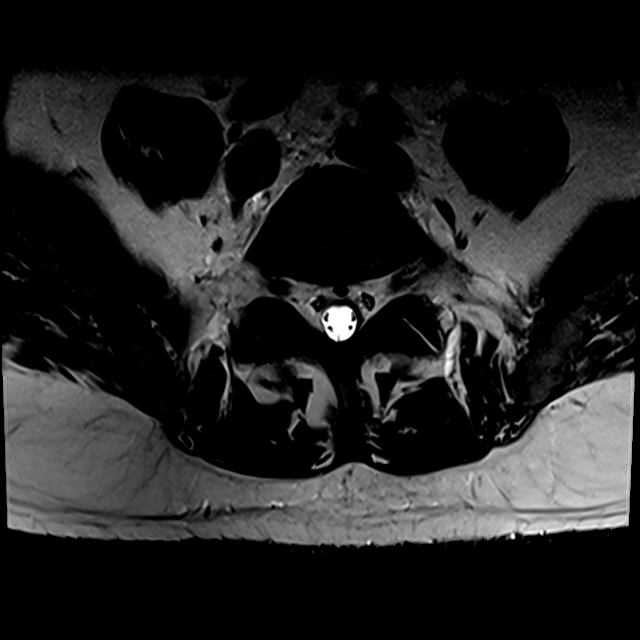
[im 11/37]
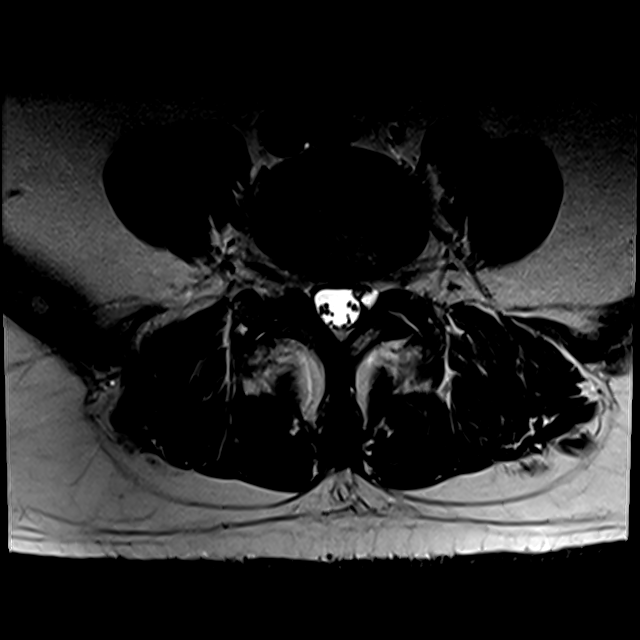
[im 16/37]
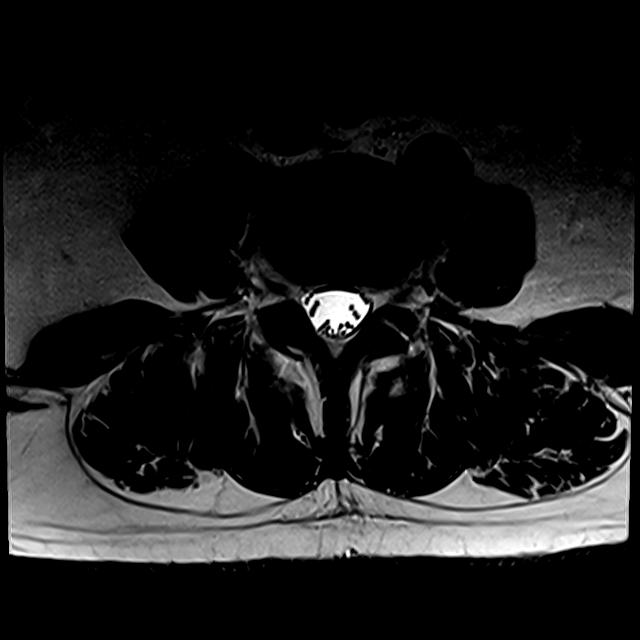
[im 19/37]
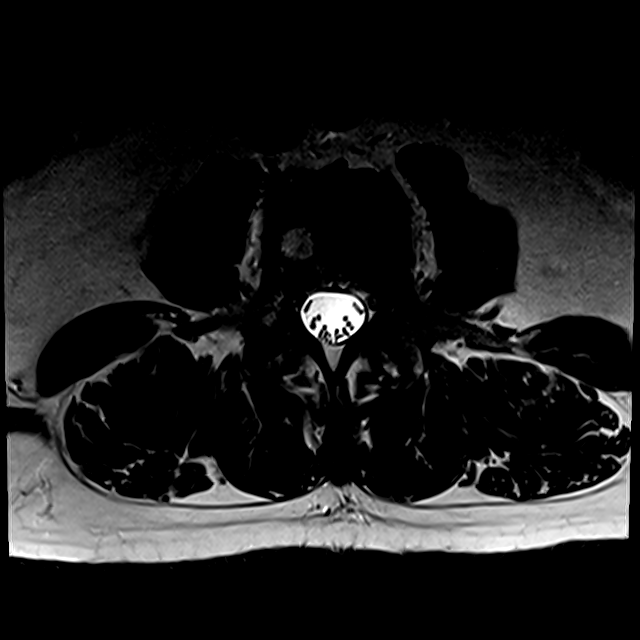
[im 21/37]
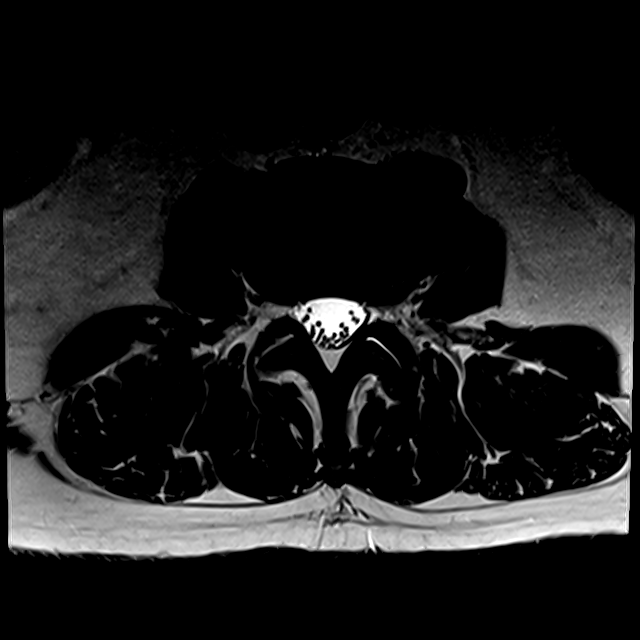
[im 26/37]
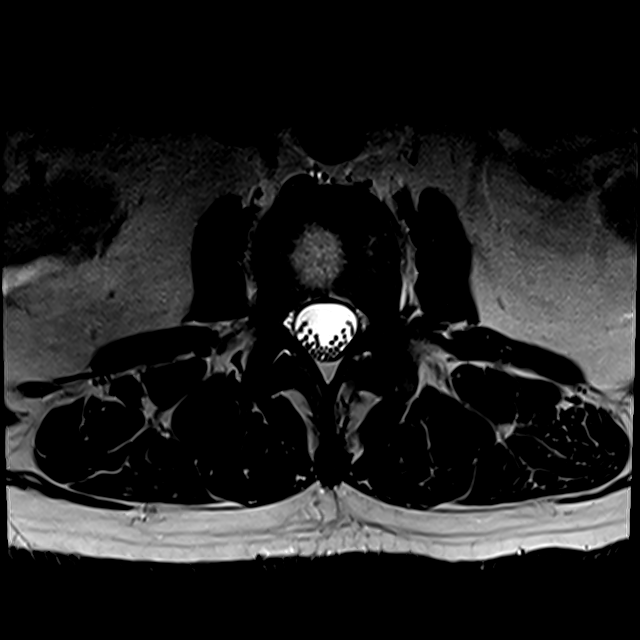
[im 31/37]
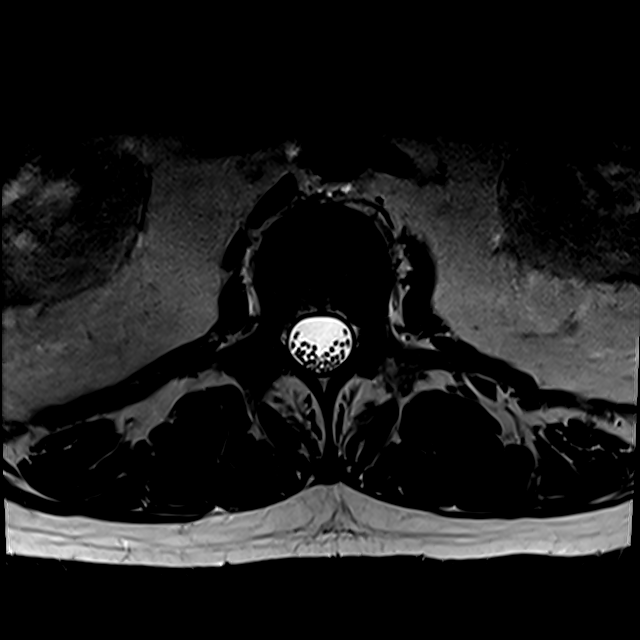

[Series 10: T1 · axial · 4.0mm · 0.74mm/px · z∈[-53,+88]mm · 3 of 37 slices shown (2 of 2)]
[im 6/37]
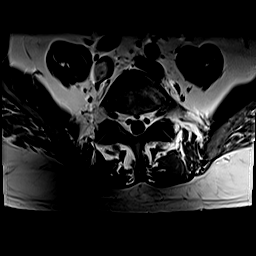
[im 19/37]
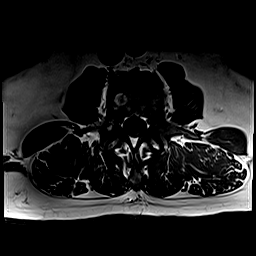
[im 31/37]
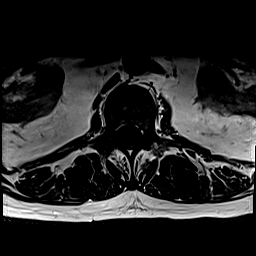

[20 of 48 positions shown; findings below may reference images not displayed]

FINDINGS: Segmentation:  5 lumbar type vertebral bodies.

Alignment:  2 mm anterolisthesis L2-3 and L4-5.

Vertebrae: Scattered vertebral body hemangiomas within the lower
thoracic region, lumbar region and sacrum. Largest is in the L2
vertebral body measuring 2 cm in diameter. There are no suspicious
focal bone findings.

Conus medullaris and cauda equina: Conus extends to the T12 level.
Conus and cauda equina appear normal.

Paraspinal and other soft tissues: Negative

Disc levels:

No disc abnormality at T11-12, T12-L1 or L1-2.

L2-3: Mild facet degeneration with 2 mm of anterolisthesis. Mild
bulging of the disc. No compressive stenosis.

L3-4: Mild facet degeneration on the left. Mild bulging of the disc.
No compressive stenosis.

L4-5: Bilateral facet arthropathy allowing 2 mm of anterolisthesis.
4 mm synovial cyst projecting inward from the facet on the left.
Bulging the disc. Stenosis of the subarticular lateral recess on the
left that could focally compress the left L5 nerve. Mild stenosis on
the right.

L5-S1: Mild bulging of the disc. Mild facet hypertrophy. No canal or
foraminal stenosis.
IMPRESSION: The significant findings are likely at the L4-5 level. There is
bilateral facet arthropathy allowing 2 mm of anterolisthesis. There
is a 4 mm synovial cyst projecting inward from the facet on the
left, resulting in narrowing of the subarticular lateral recess on
the left that could focally compress the left L5 nerve.
Additionally, the facet arthropathy could be a cause of back pain or
referred facet syndrome pain.

Other, lesser, likely subclinical findings as outlined above.

## 2019-11-19 DIAGNOSIS — D1801 Hemangioma of skin and subcutaneous tissue: Secondary | ICD-10-CM | POA: Diagnosis not present

## 2019-11-19 DIAGNOSIS — L814 Other melanin hyperpigmentation: Secondary | ICD-10-CM | POA: Diagnosis not present

## 2019-11-19 DIAGNOSIS — L821 Other seborrheic keratosis: Secondary | ICD-10-CM | POA: Diagnosis not present

## 2019-11-19 DIAGNOSIS — Z85828 Personal history of other malignant neoplasm of skin: Secondary | ICD-10-CM | POA: Diagnosis not present

## 2019-11-19 DIAGNOSIS — L570 Actinic keratosis: Secondary | ICD-10-CM | POA: Diagnosis not present

## 2019-11-19 DIAGNOSIS — L905 Scar conditions and fibrosis of skin: Secondary | ICD-10-CM | POA: Diagnosis not present

## 2019-11-19 DIAGNOSIS — D229 Melanocytic nevi, unspecified: Secondary | ICD-10-CM | POA: Diagnosis not present

## 2019-11-19 DIAGNOSIS — L819 Disorder of pigmentation, unspecified: Secondary | ICD-10-CM | POA: Diagnosis not present

## 2019-12-07 ENCOUNTER — Other Ambulatory Visit: Payer: Self-pay

## 2019-12-07 ENCOUNTER — Encounter: Payer: Self-pay | Admitting: Family Medicine

## 2019-12-07 ENCOUNTER — Ambulatory Visit (INDEPENDENT_AMBULATORY_CARE_PROVIDER_SITE_OTHER): Payer: Medicare Other | Admitting: Family Medicine

## 2019-12-07 VITALS — BP 138/64 | HR 85 | Temp 98.5°F | Ht 66.0 in | Wt 174.7 lb

## 2019-12-07 DIAGNOSIS — E119 Type 2 diabetes mellitus without complications: Secondary | ICD-10-CM

## 2019-12-07 DIAGNOSIS — Z23 Encounter for immunization: Secondary | ICD-10-CM | POA: Diagnosis not present

## 2019-12-07 LAB — POCT GLYCOSYLATED HEMOGLOBIN (HGB A1C): Hemoglobin A1C: 7 % — AB (ref 4.0–5.6)

## 2019-12-07 NOTE — Progress Notes (Signed)
This visit occurred during the SARS-CoV-2 public health emergency.  Safety protocols were in place, including screening questions prior to the visit, additional usage of staff PPE, and extensive cleaning of exam room while observing appropriate contact time as indicated for disinfecting solutions.  Diabetes:  No meds.  Hypoglycemic episodes:no Hyperglycemic episodes:no Feet problems:no Blood Sugars averaging: not checked.  eye exam within last year: 05/2019 at [place] [facility].  A1c done at [facility].  7.  D/w pt.    Flu shot today.  covid vaccine and flu shot d/w pt.  He can get a booster later on.   He is going to [place] soon.  D/w pt.   Meds, vitals, and allergies reviewed.  ROS: Per HPI unless specifically indicated in ROS section   GEN: nad, alert and oriented HEENT: ncat NECK: supple w/o LA CV: rrr. PULM: ctab, no inc wob ABD: soft, +bs EXT: no edema SKIN: no acute rash  Diabetic foot exam: Normal inspection No skin breakdown No calluses  Normal DP pulses Normal sensation to light touch and monofilament Nails normal

## 2019-12-07 NOTE — Patient Instructions (Signed)
Don't change your meds for now.  Keep going as is.  Enjoy [place].  Take care.  Glad to see you. Plan on recheck in about 4 month with A1c at the visit like today.

## 2019-12-09 NOTE — Assessment & Plan Note (Signed)
A1c done at [facility].  7.  D/w pt. would continue off medications. Recheck periodically. Continue work on diet and exercise. He will update me as needed.  Flu shot today.  covid vaccine and flu shot d/w pt.  He can get a booster later on.

## 2019-12-22 DIAGNOSIS — L570 Actinic keratosis: Secondary | ICD-10-CM | POA: Diagnosis not present

## 2019-12-22 DIAGNOSIS — L821 Other seborrheic keratosis: Secondary | ICD-10-CM | POA: Diagnosis not present

## 2020-01-18 ENCOUNTER — Telehealth: Payer: Self-pay

## 2020-01-18 ENCOUNTER — Telehealth: Payer: Self-pay | Admitting: Family Medicine

## 2020-01-18 NOTE — Telephone Encounter (Signed)
Patient states that he has a sore throat and congestion. Temperature is 98.7, oxygen 98, pulse is 95.  He is wanting something called in. Offered the patient a virtual visit and he didn't really want to do a visit. Advised patient that a visit may be needed to determine what he needs. EM

## 2020-01-18 NOTE — Telephone Encounter (Signed)
Spoke with pt regarding getting some medication called in for what he says is a cold. I told him that he could schedule a video visit if he did not want to come in(He refused). He said that he will check back with the office Monday when Dr. Para March returns.

## 2020-01-25 ENCOUNTER — Telehealth: Payer: Self-pay | Admitting: Family Medicine

## 2020-01-25 ENCOUNTER — Other Ambulatory Visit: Payer: Self-pay | Admitting: Family Medicine

## 2020-01-25 ENCOUNTER — Other Ambulatory Visit: Payer: Medicare Other

## 2020-01-25 DIAGNOSIS — R059 Cough, unspecified: Secondary | ICD-10-CM

## 2020-01-25 NOTE — Telephone Encounter (Signed)
Noted. Thanks.

## 2020-01-25 NOTE — Telephone Encounter (Signed)
Patient states that he has a little bit of a cold. Patient is requesting that we call in some medicine for him. Patient has states that he has a cold for 7 days or so. Please advise. EM

## 2020-01-25 NOTE — Telephone Encounter (Signed)
Wife also had virtual visit today with Dr. Reece Agar.  She is being tested for covid later today and Dr. Reece Agar put in the order to get this patient tested at the same time.  I thank all involved.

## 2020-01-25 NOTE — Telephone Encounter (Signed)
Sending info to triage. EM

## 2020-01-25 NOTE — Progress Notes (Signed)
Pt declined COVID test - cancelled.

## 2020-01-25 NOTE — Telephone Encounter (Signed)
Please triage patient.  Thanks!

## 2020-01-25 NOTE — Telephone Encounter (Addendum)
Per appt notes pt is scheduled for lab covid test at [facility] today at 3:15. I spoke with pt; pt said last wk coughed x 2 and once was prod cough with green phlegm. Pt said he had taken some alka seltzer cold x3 and now his head is not stopped up; no cough, I reviewed covid symptoms with pt and he said he was not having any symptoms at all; pt said his head is clear and he feels good. Pt wants to cancel the covid test scheduled for this afternoon (covid test cancelled per pt) because he does not trust those test.pt said to let Dr Para March know if he has any symptoms or problems he will call [facility]. Sending note to Dr Para March.

## 2020-01-25 NOTE — Addendum Note (Signed)
Addended by: Eustaquio Boyden on: 01/25/2020 06:09 PM   Modules accepted: Orders

## 2020-01-25 NOTE — Progress Notes (Signed)
See phone note with wife today.  Husband with cough, congestion, rhinorrhea.  Requests covid testing. Ordered.

## 2020-01-25 NOTE — Telephone Encounter (Signed)
[facility] Day - Client TELEPHONE ADVICE RECORD Access[facility]urse Patient Name: [name] Gender: Male DOB: 10-25-1940 Age: 80 Y 7 M 16 D Return Phone Number: [phone] (Primary) Address: City/State/Zip: [place] [address] Client [facility] Day - Client Client Site [facility] - Day Contact Type Call Who Is Calling Patient / Member / Family / Caregiver Call Type Triage / Clinical Relationship To Patient Self Return Phone Number [phone] (Primary) Chief Complaint Cough Reason for Call Symptomatic / Request for Health Information Initial Comment call trans from answering service - states doctor advised her to transfer and triage caller not sure if its covid but he has a nasty cough . Translation No Nurse Assessment Nurse: Wyn Quaker, RN, Marylene Land Date/Time (Eastern Time): 01/25/2020 9:13:55 AM Confirm and document reason for call. If symptomatic, describe symptoms. ---Caller stated that he doesn't think he has COVID. He has had a cough for about a week. He's starting to feel better. Does the patient have any new or worsening symptoms? ---Yes Will a triage be completed? ---Yes Related visit to physician within the last 2 weeks? ---No Does the PT have any chronic conditions? (i.e. diabetes, asthma, this includes High risk factors for pregnancy, etc.) ---Yes List chronic conditions. ---high cholesterol, eye pill Is this a behavioral health or substance abuse call? ---No Guidelines Guideline Title Affirmed Question Affirmed Notes Nurse Date/Time (Eastern Time) COVID-19 - Diagnosed or Suspected [1] COVID-19 infection suspected by caller or triager AND [2] mild symptoms (cough, fever, or others) AND [3] has not gotten tested yet Wyn Quaker, RN, Marylene Land 01/25/2020 9:16:02 AM Disp. Time Lamount Cohen Time) Disposition Final User 01/25/2020 8:58:41 AM Attempt made - no message left Dew, RN, Marylene Land 01/25/2020 9:20:23 AM Call PCP  when Office is Open Yes Dew, RN, Marylene Land PLEASE NOTE: All timestamps contained within this report are represented as [place] Standard Time. CONFIDENTIALTY NOTICE: This fax transmission is intended only for the addressee. It contains information that is legally privileged, confidential or otherwise protected from use or disclosure. If you are not the intended recipient, you are strictly prohibited from reviewing, disclosing, copying using or disseminating any of this information or taking any action in reliance on or regarding this information. If you have received this fax in error, please notify us immediately by telephone so that we can arrange for its return to [place]. Phone: [phone], Toll-Free: [phone], Fax: [phone] Page: 2 of 2 Call Id: [id number] Caller Disagree/Comply Disagree Caller Understands Yes PreDisposition InappropriateToAsk Care Advice Given Per Guideline CALL PCP WHEN OFFICE IS OPEN: * You need to discuss this with your doctor (or NP/PA) within the next few days. * Call the office when it is open. CARE ADVICE given per COVID-19 - DIAGNOSED OR SUSPECTED (Adult) guideline. Comments User: Gifford Shave, RN Date/Time Lamount Cohen Time): 01/25/2020 9:22:30 AM Caller kept interrupting with questions while I was trying to give him advice. He stated that he does not believe in COVID being an issue. He is insistent that there's no way he has it despite telling me that he's currently at work somewhere. His cough is occ, productive. When I told him we would have to see him and get a COVID test to give antibiotics, he hung up. I was not able to give any call back or complete other ins. Referrals GO TO FACILITY REFUSED REFERRED TO PCP OFFICE

## 2020-03-28 ENCOUNTER — Ambulatory Visit: Payer: Medicare Other | Admitting: Family Medicine

## 2020-04-04 ENCOUNTER — Encounter: Payer: Self-pay | Admitting: Family Medicine

## 2020-04-04 ENCOUNTER — Ambulatory Visit (INDEPENDENT_AMBULATORY_CARE_PROVIDER_SITE_OTHER): Payer: Medicare Other | Admitting: Family Medicine

## 2020-04-04 ENCOUNTER — Other Ambulatory Visit: Payer: Self-pay

## 2020-04-04 VITALS — BP 110/72 | HR 81 | Temp 98.0°F | Ht 66.0 in | Wt 175.0 lb

## 2020-04-04 DIAGNOSIS — E119 Type 2 diabetes mellitus without complications: Secondary | ICD-10-CM

## 2020-04-04 LAB — POCT GLYCOSYLATED HEMOGLOBIN (HGB A1C): Hemoglobin A1C: 7.4 % — AB (ref 4.0–5.6)

## 2020-04-04 NOTE — Progress Notes (Unsigned)
This visit occurred during the SARS-CoV-2 public health emergency.  Safety protocols were in place, including screening questions prior to the visit, additional usage of staff PPE, and extensive cleaning of exam room while observing appropriate contact time as indicated for disinfecting solutions.  Diabetes:  No meds.  Hypoglycemic episodes: no Hyperglycemic episodes: no Feet problems: no  Sugar not checked at home. eye exam within last year: yes A1c d/w pt at [name].  He hasn't been walking as much.    We talked about covid booster.  He did not want to get the booster.  I advised him to get it.  Routine instructions given to patient.  I am glad he at least got the first 2 shots.  Meds, vitals, and allergies reviewed.  ROS: Per HPI unless specifically indicated in ROS section   GEN: nad, alert and oriented HEENT: ncat NECK: supple w/o LA CV: rrr. PULM: ctab, no inc wob ABD: soft, +bs EXT: no edema SKIN: no acute rash  Diabetic foot exam: Normal inspection No skin breakdown No calluses  Normal DP pulses Normal sensation to light touch and monofilament Nails normal

## 2020-04-04 NOTE — Patient Instructions (Addendum)
Recheck in about 5 months at a physical/yearly visit (Augsust).  We can do labs at the visit, not ahead of time.   Take care.  Glad to see you. Thanks for your effort.

## 2020-04-06 DIAGNOSIS — L819 Disorder of pigmentation, unspecified: Secondary | ICD-10-CM | POA: Diagnosis not present

## 2020-04-06 DIAGNOSIS — L821 Other seborrheic keratosis: Secondary | ICD-10-CM | POA: Diagnosis not present

## 2020-04-06 DIAGNOSIS — L814 Other melanin hyperpigmentation: Secondary | ICD-10-CM | POA: Diagnosis not present

## 2020-04-06 DIAGNOSIS — L905 Scar conditions and fibrosis of skin: Secondary | ICD-10-CM | POA: Diagnosis not present

## 2020-04-06 DIAGNOSIS — L700 Acne vulgaris: Secondary | ICD-10-CM | POA: Diagnosis not present

## 2020-04-06 DIAGNOSIS — C44319 Basal cell carcinoma of skin of other parts of face: Secondary | ICD-10-CM | POA: Diagnosis not present

## 2020-04-06 DIAGNOSIS — D1801 Hemangioma of skin and subcutaneous tissue: Secondary | ICD-10-CM | POA: Diagnosis not present

## 2020-04-06 DIAGNOSIS — Z85820 Personal history of malignant melanoma of skin: Secondary | ICD-10-CM | POA: Diagnosis not present

## 2020-04-06 DIAGNOSIS — Z85828 Personal history of other malignant neoplasm of skin: Secondary | ICD-10-CM | POA: Diagnosis not present

## 2020-04-06 DIAGNOSIS — D485 Neoplasm of uncertain behavior of skin: Secondary | ICD-10-CM | POA: Diagnosis not present

## 2020-04-06 DIAGNOSIS — D229 Melanocytic nevi, unspecified: Secondary | ICD-10-CM | POA: Diagnosis not present

## 2020-04-06 NOTE — Assessment & Plan Note (Addendum)
Not beenSee above regarding vaccination. He has not been walking as much.  He will work more then we can recheck his A1c periodically.  He agrees.  Continue work on diet.

## 2020-05-19 ENCOUNTER — Encounter: Payer: Self-pay | Admitting: Family Medicine

## 2020-05-19 DIAGNOSIS — C44311 Basal cell carcinoma of skin of nose: Secondary | ICD-10-CM | POA: Diagnosis not present

## 2020-06-27 DIAGNOSIS — H353132 Nonexudative age-related macular degeneration, bilateral, intermediate dry stage: Secondary | ICD-10-CM | POA: Diagnosis not present

## 2020-06-29 DIAGNOSIS — L820 Inflamed seborrheic keratosis: Secondary | ICD-10-CM | POA: Diagnosis not present

## 2020-07-11 ENCOUNTER — Encounter (INDEPENDENT_AMBULATORY_CARE_PROVIDER_SITE_OTHER): Payer: Medicare Other | Admitting: Ophthalmology

## 2020-07-11 ENCOUNTER — Other Ambulatory Visit: Payer: Self-pay

## 2020-07-11 DIAGNOSIS — H33012 Retinal detachment with single break, left eye: Secondary | ICD-10-CM | POA: Diagnosis not present

## 2020-07-11 DIAGNOSIS — H43813 Vitreous degeneration, bilateral: Secondary | ICD-10-CM

## 2020-07-11 DIAGNOSIS — H353122 Nonexudative age-related macular degeneration, left eye, intermediate dry stage: Secondary | ICD-10-CM

## 2020-07-11 DIAGNOSIS — H2513 Age-related nuclear cataract, bilateral: Secondary | ICD-10-CM | POA: Diagnosis not present

## 2020-07-11 DIAGNOSIS — H353211 Exudative age-related macular degeneration, right eye, with active choroidal neovascularization: Secondary | ICD-10-CM | POA: Diagnosis not present

## 2020-07-11 DIAGNOSIS — I10 Essential (primary) hypertension: Secondary | ICD-10-CM | POA: Diagnosis not present

## 2020-07-11 DIAGNOSIS — H35033 Hypertensive retinopathy, bilateral: Secondary | ICD-10-CM | POA: Diagnosis not present

## 2020-07-14 ENCOUNTER — Other Ambulatory Visit: Payer: Self-pay

## 2020-07-14 ENCOUNTER — Encounter (INDEPENDENT_AMBULATORY_CARE_PROVIDER_SITE_OTHER): Payer: Medicare Other | Admitting: Ophthalmology

## 2020-07-14 DIAGNOSIS — H33302 Unspecified retinal break, left eye: Secondary | ICD-10-CM

## 2020-08-10 ENCOUNTER — Encounter (INDEPENDENT_AMBULATORY_CARE_PROVIDER_SITE_OTHER): Payer: Medicare Other | Admitting: Ophthalmology

## 2020-08-10 ENCOUNTER — Other Ambulatory Visit: Payer: Self-pay

## 2020-08-10 DIAGNOSIS — H353211 Exudative age-related macular degeneration, right eye, with active choroidal neovascularization: Secondary | ICD-10-CM

## 2020-08-10 DIAGNOSIS — H33302 Unspecified retinal break, left eye: Secondary | ICD-10-CM

## 2020-08-10 DIAGNOSIS — H353122 Nonexudative age-related macular degeneration, left eye, intermediate dry stage: Secondary | ICD-10-CM

## 2020-08-10 DIAGNOSIS — H43813 Vitreous degeneration, bilateral: Secondary | ICD-10-CM | POA: Diagnosis not present

## 2020-08-10 LAB — HM DIABETES EYE EXAM

## 2020-09-05 ENCOUNTER — Other Ambulatory Visit: Payer: Self-pay

## 2020-09-05 ENCOUNTER — Encounter: Payer: Self-pay | Admitting: Family Medicine

## 2020-09-05 ENCOUNTER — Ambulatory Visit (INDEPENDENT_AMBULATORY_CARE_PROVIDER_SITE_OTHER): Payer: Medicare Other | Admitting: Family Medicine

## 2020-09-05 VITALS — BP 138/62 | HR 76 | Temp 98.1°F | Ht 66.0 in | Wt 174.0 lb

## 2020-09-05 DIAGNOSIS — E785 Hyperlipidemia, unspecified: Secondary | ICD-10-CM | POA: Diagnosis not present

## 2020-09-05 DIAGNOSIS — R1011 Right upper quadrant pain: Secondary | ICD-10-CM

## 2020-09-05 DIAGNOSIS — Z0000 Encounter for general adult medical examination without abnormal findings: Secondary | ICD-10-CM | POA: Diagnosis not present

## 2020-09-05 DIAGNOSIS — E119 Type 2 diabetes mellitus without complications: Secondary | ICD-10-CM | POA: Diagnosis not present

## 2020-09-05 DIAGNOSIS — Z7189 Other specified counseling: Secondary | ICD-10-CM

## 2020-09-05 LAB — CBC WITH DIFFERENTIAL/PLATELET
Basophils Absolute: 0 10*3/uL (ref 0.0–0.1)
Basophils Relative: 0.9 % (ref 0.0–3.0)
Eosinophils Absolute: 0.1 10*3/uL (ref 0.0–0.7)
Eosinophils Relative: 2.7 % (ref 0.0–5.0)
HCT: 44.4 % (ref 39.0–52.0)
Hemoglobin: 15.2 g/dL (ref 13.0–17.0)
Lymphocytes Relative: 28.8 % (ref 12.0–46.0)
Lymphs Abs: 1.6 10*3/uL (ref 0.7–4.0)
MCHC: 34.3 g/dL (ref 30.0–36.0)
MCV: 89.5 fl (ref 78.0–100.0)
Monocytes Absolute: 0.5 10*3/uL (ref 0.1–1.0)
Monocytes Relative: 9.4 % (ref 3.0–12.0)
Neutro Abs: 3.2 10*3/uL (ref 1.4–7.7)
Neutrophils Relative %: 58.2 % (ref 43.0–77.0)
Platelets: 146 10*3/uL — ABNORMAL LOW (ref 150.0–400.0)
RBC: 4.96 Mil/uL (ref 4.22–5.81)
RDW: 13 % (ref 11.5–15.5)
WBC: 5.4 10*3/uL (ref 4.0–10.5)

## 2020-09-05 LAB — COMPREHENSIVE METABOLIC PANEL
ALT: 17 U/L (ref 0–53)
AST: 14 U/L (ref 0–37)
Albumin: 4.9 g/dL (ref 3.5–5.2)
Alkaline Phosphatase: 59 U/L (ref 39–117)
BUN: 13 mg/dL (ref 6–23)
CO2: 30 mEq/L (ref 19–32)
Calcium: 10.1 mg/dL (ref 8.4–10.5)
Chloride: 98 mEq/L (ref 96–112)
Creatinine, Ser: 0.6 mg/dL (ref 0.40–1.50)
GFR: 92.63 mL/min (ref >60.00)
Glucose, Bld: 172 mg/dL — ABNORMAL HIGH (ref 70–99)
Potassium: 4.2 mEq/L (ref 3.5–5.1)
Sodium: 138 mEq/L (ref 135–145)
Total Bilirubin: 0.9 mg/dL (ref 0.2–1.2)
Total Protein: 7.3 g/dL (ref 6.0–8.3)

## 2020-09-05 LAB — LIPID PANEL
Cholesterol: 171 mg/dL (ref 0–200)
HDL: 52.8 mg/dL (ref >39.00)
LDL Cholesterol: 101 mg/dL — ABNORMAL HIGH (ref 0–99)
NonHDL: 118.68
Total CHOL/HDL Ratio: 3
Triglycerides: 88 mg/dL (ref 0.0–149.0)
VLDL: 17.6 mg/dL (ref 0.0–40.0)

## 2020-09-05 LAB — MICROALBUMIN / CREATININE URINE RATIO
Creatinine,U: 53 mg/dL
Microalb Creat Ratio: 1.3 mg/g (ref 0.0–30.0)
Microalb, Ur: <0.7 mg/dL (ref 0.0–1.9)

## 2020-09-05 LAB — HEMOGLOBIN A1C: Hgb A1c MFr Bld: 7.9 % — ABNORMAL HIGH (ref 4.6–6.5)

## 2020-09-05 MED ORDER — PRAVASTATIN SODIUM 20 MG PO TABS: 20.0000 mg | ORAL_TABLET | Freq: Every day | ORAL | 3 refills | Status: DC

## 2020-09-05 NOTE — Progress Notes (Signed)
This visit occurred during the SARS-CoV-2 public health emergency.  Safety protocols were in place, including screening questions prior to the visit, additional usage of staff PPE, and extensive cleaning of exam room while observing appropriate contact time as indicated for disinfecting solutions.  I have personally reviewed the Medicare Annual Wellness questionnaire and have noted 1. The patient's medical and social history 2. Their use of alcohol, tobacco or illicit drugs 3. Their current medications and supplements 4. The patient's functional ability including ADL's, fall risks, home safety risks and hearing or visual             impairment. 5. Diet and physical activities 6. Evidence for depression or mood disorders  The patients weight, height, BMI have been recorded in the chart and visual acuity is per eye clinic.  I have made referrals, counseling and provided education to the patient based review of the above and I have provided the pt with a written personalized care plan for preventive services.  Provider list updated- see scanned forms.  Routine anticipatory guidance given to patient.  See health maintenance. The possibility exists that previously documented standard health maintenance information may have been brought forward from a previous encounter into this note.  If needed, that same information has been updated to reflect the current situation based on today's encounter.    Flu discussed with patient, not available yet in clinic. Shingles vaccine discussed with patient.  Status post Zostavax. PNA up-to-date Tetanus may be cheaper at pharmacy, discussed with patient. COVID-vaccine discussed with patient. Colonoscopy 2017 Prostate cancer screening and PSA options (with potential risks and benefits of testing vs not testing) were discussed along with recent recs/guidelines.  He declined testing PSA at this point. Advance directive-wife designated patient were  incapacitated. Cognitive function addressed- see scanned forms- and if abnormal then additional documentation follows.   In addition to [facility] Wellness, follow up visit for the below conditions:  He is going to [place] soon.  He will be back to [place] in the spring 2023.  He enjoys time at [employer].  Occ stinging in the RUQ, comes and goes.  No pain with movement.  Going on for about 10 days.  No clear trigger.  We walked w/o pain.  Normal BMs.  Felt like it was on the skin but not deep in.  No fevers.  No chills.  No blood in BMs.  No other pains.  Not worse with eating.  It happens when it happens, ie randomly.  Can happen at rest.  Can last about a few hours.  No pain this AM initially but then noted this AM on the way here. No severe but annoying.    Using CPAP.  Compliant.  Diabetes:  No meds.  Hypoglycemic episodes: no sx  Hyperglycemic episodes: no sx Feet problems: no Blood Sugars averaging: not checked.   eye exam within last year: yes Labs pending.    Elevated Cholesterol: Using medications without problems:yes Muscle aches: no Diet compliance:yes Exercise:yes Labs pending.    PMH and SH reviewed  Meds, vitals, and allergies reviewed.   ROS: Per HPI.  Unless specifically indicated otherwise in HPI, the patient denies:  General: fever. Eyes: acute vision changes ENT: sore throat Cardiovascular: chest pain Respiratory: SOB GI: vomiting GU: dysuria Musculoskeletal: acute back pain Derm: acute rash Neuro: acute motor dysfunction Psych: worsening mood Endocrine: polydipsia Heme: bleeding Allergy: hayfever  GEN: nad, alert and oriented HEENT: ncat NECK: supple w/o LA CV: rrr. PULM:  ctab, no inc wob ABD: soft, +bs, minimally ttp RUQ along the R lower ribs, murphy neg and normal skin sensation EXT: no edema SKIN: no acute rash  Diabetic foot exam: Normal inspection No skin breakdown No calluses  Normal DP pulses Normal sensation to  light touch and monofilament Nails normal

## 2020-09-05 NOTE — Patient Instructions (Addendum)
Go to the lab on the way out.   If you have mychart we'll likely use that to update you.    Don't change your meds for now.   If the abdominal discomfort continues, then let me know. I expect it to gradually get better.  I would hold off with routine vaccination until your abdominal discomfort is better.   Take care.  Glad to see you. Plan on recheck in 6 months with A1c at the visit. You don't have to fast.

## 2020-09-07 ENCOUNTER — Encounter (INDEPENDENT_AMBULATORY_CARE_PROVIDER_SITE_OTHER): Payer: Medicare Other | Admitting: Ophthalmology

## 2020-09-07 DIAGNOSIS — R1011 Right upper quadrant pain: Secondary | ICD-10-CM | POA: Insufficient documentation

## 2020-09-07 NOTE — Assessment & Plan Note (Signed)
See notes on labs.  No medication currently.  Continue work on diet and exercise.

## 2020-09-07 NOTE — Assessment & Plan Note (Signed)
This looks like it is along the edge of the right lower ribs on the abdomen and not deep in the abdomen itself.  Overall benign exam.  Reasonable to check routine labs today.  Discussed with patient about holding off on other vaccines/etc. until he had worked up for this symptom though this appears to be a benign issue.  He agreed.

## 2020-09-07 NOTE — Assessment & Plan Note (Signed)
  Flu discussed with patient, not available yet in clinic. Shingles vaccine discussed with patient.  Status post Zostavax. PNA up-to-date Tetanus may be cheaper at pharmacy, discussed with patient. COVID-vaccine discussed with patient. Colonoscopy 2017 Prostate cancer screening and PSA options (with potential risks and benefits of testing vs not testing) were discussed along with recent recs/guidelines.  He declined testing PSA at this point. Advance directive-wife designated patient were incapacitated. Cognitive function addressed- see scanned forms- and if abnormal then additional documentation follows.

## 2020-09-07 NOTE — Assessment & Plan Note (Signed)
Continue pravastatin for now.  See notes on labs.  Continue work on diet and exercise.

## 2020-09-07 NOTE — Assessment & Plan Note (Signed)
Advance directive-wife designated patient were incapacitated. 

## 2020-09-08 ENCOUNTER — Telehealth: Payer: Self-pay

## 2020-09-08 ENCOUNTER — Telehealth: Payer: Self-pay | Admitting: Family Medicine

## 2020-09-08 NOTE — Telephone Encounter (Signed)
Mr. Roger [name] called in and stated that he is still having the stinging feeling in his stomach and was told to call back if he is still having it.

## 2020-09-08 NOTE — Telephone Encounter (Signed)
I would try an otc lidocaine patch on the area and see if that helps.  If he has other sx (rash, change with eating, etc), then let me know.  Thanks.

## 2020-09-08 NOTE — Telephone Encounter (Signed)
Advised patient to try the OTC lidocaine patch on the area. Patient states he will try that and let us know.

## 2020-09-08 NOTE — Telephone Encounter (Signed)
I spoke with pt; pt seen 09/05/20; pt said for 2 wks has had a burning sensation in RUQ that is not dull nor sharpe pain; pt said more of an annoying nuisance. Pt said has not changed since seen on 09/05/20.This morning no fever T is 98.3. pt said he feels good except for the burning sensation that randomly comes; cannot tell any difference with what ever pt eats. No blood in BM seen and no N&V. [place] Pt request cb after reviewed by Dr Damita Dunnings.

## 2020-09-08 NOTE — Telephone Encounter (Signed)
[facility] - Client TELEPHONE ADVICE RECORD [facility] Patient Name: Roger [name] [facility] Gender: Male DOB: 1940-06-04 Age: 80 Y 70 M Return Phone Number: [phone] (Primary) Address: City/ State/ Zip: [place] [address] Client [facility] [facility] - Client Client Site [place] - Day Physician Renford Dills - MD Contact Type Call Who Is Calling Patient / Member / Family / Caregiver Call Type Triage / Clinical Relationship To Patient Self Return Phone Number [phone] (Primary) Chief Complaint Abdominal Pain Reason for Call Symptomatic / Request for Health Information Initial Comment Caller states he was seen Monday and has a stinging sensation in his stomach. Translation No Nurse Assessment Nurse: Rodney Cruise, RN, Hilliard Clark Date/Time (Eastern Time): 09/08/2020 8:32:24 AM Confirm and document reason for call. If symptomatic, describe symptoms. ---Caller states has as tinging in stomach under right ribs in stomach, still has the pain. Does the patient have any new or worsening symptoms? ---Yes Will a triage be completed? ---Yes Related visit to physician within the last 2 weeks? ---Yes Does the PT have any chronic conditions? (i.e. diabetes, asthma, this includes High risk factors for pregnancy, etc.) ---Yes List chronic conditions. ---GERD Is this a behavioral health or substance abuse call? ---No Guidelines Guideline Title Affirmed Question Affirmed Notes Nurse Date/Time (Eastern Time) Abdominal Pain - Upper [1] MILD pain (e.g., does not interfere with normal activities) AND [2] comes and goes (cramps) AND [3] present > 72 hours (Exception: this same abdominal pain is a chronic symptom recurrent or ongoing Baxter, RN, Hilliard Clark 09/08/2020 8:34:10 AM PLEASE NOTE: All timestamps contained within this report are represented as [place] Standard Time. CONFIDENTIALTY NOTICE: This fax transmission is  intended only for the addressee. It contains information that is legally privileged, confidential or otherwise protected from use or disclosure. If you are not the intended recipient, you are strictly prohibited from reviewing, disclosing, copying using or disseminating any of this information or taking any action in reliance on or regarding this information. If you have received this fax in error, please notify us immediately by telephone so that we can arrange for its return to [place]. Phone: [phone], Toll-Free: [phone], Fax: [phone] Page: 2 of 2 Call Id: [id number] Guidelines Guideline Title Affirmed Question Affirmed Notes Nurse Date/Time Eilene Ghazi Time) AND present > 4 weeks) Disp. Time Eilene Ghazi Time) Disposition Final User 09/08/2020 8:40:42 AM See PCP within 24 Hours Yes Baxter, RN, Hilliard Clark Caller Disagree/Comply Comply Caller Understands Yes PreDisposition Did not know what to do Care Advice Given Per Guideline SEE PCP WITHIN 24 HOURS: CALL BACK IF: CARE ADVICE given per Abdominal Pain, Upper (Adult) guideline. Comments User: Miles Costain, RN Date/Time Eilene Ghazi Time): 09/08/2020 8:40:39 AM Caller states thought during last appointment provider was going to prescribe a medication if symptoms have not gotten better. Symptoms have not improved, caller wanted someone to speak with PCP prior to making an appointment to see if another appointment is needed or if needs to be seen again. Please contact and advise. Referrals GO TO FACILITY REFUSED REFERRED TO PCP OFFICE

## 2020-09-12 ENCOUNTER — Other Ambulatory Visit: Payer: Self-pay

## 2020-09-12 ENCOUNTER — Telehealth: Payer: Self-pay | Admitting: Family Medicine

## 2020-09-12 ENCOUNTER — Ambulatory Visit
Admission: RE | Admit: 2020-09-12 | Discharge: 2020-09-12 | Disposition: A | Discharge status: Home / Self Care | Payer: Medicare Other | Source: Ambulatory Visit | Attending: Family Medicine | Admitting: Family Medicine

## 2020-09-12 DIAGNOSIS — R0781 Pleurodynia: Secondary | ICD-10-CM

## 2020-09-12 NOTE — Telephone Encounter (Signed)
I put in the order.  Thanks. 

## 2020-09-12 NOTE — Telephone Encounter (Signed)
[facility] Night - Client Nonclinical Telephone Record  [facility] Client [facility] [place] [facility] - Client Client Site [facility] Physician Renford Dills - MD Contact Type Call Who Is Calling Patient / Member / Family / Caregiver Caller Name [place] Phone Number [phone] Patient Name Roger [name] Patient DOB 1940/02/10 Call Type Message Only Information Provided Reason for Call Request to Schedule Office Appointment Initial Comment Caller states he is having right side shoulder pain. Pt would like an x-ray. Patient request to speak to RN No Additional Comment Pt would like an x-ray at the [facility] [employer]. Time Disposition Final User 09/12/2020 7:29:04 AM General Information Provided Yes Paulita Cradle Call Closed By: Paulita Cradle Transaction Date/Time: 09/12/2020 7:24:51 AM (ET)

## 2020-09-12 NOTE — Telephone Encounter (Signed)
[facility] Night - Client Nonclinical Telephone Record  [facility] Client [facility] Night - Client Client Site [facility] Physician Renford Dills - MD Contact Type Call Who Is Calling Patient / Member / Family / Caregiver Caller Name [place] Phone Number [phone] Patient Name Roger [name] Patient DOB [birth year] Call Type Message Only Information Provided Reason for Call Request for General Office Information Initial Comment Caller states he wants a chest xray at [facility]. Time Disposition Final User 09/12/2020 6:42:51 AM General Information Provided Yes Mable Paris Call Closed By: Mable Paris Transaction Date/Time: 09/12/2020 6:39:35 AM (ET)

## 2020-09-12 NOTE — Telephone Encounter (Signed)
Tried to call patient but no answer and could not leave a VM

## 2020-09-12 NOTE — Telephone Encounter (Signed)
Roger [name] called in wanted to know if he can get a chest x-ray due to he still having pain in his stomach and its been over 2 weeks he was trying to get it done as soon as today  and he has used the heat and cool patch and its not working and he used 4. He wants to go to [facility] [address] and he talked to them and they told him if the dr can send the order they can get him in today

## 2020-09-13 NOTE — Telephone Encounter (Signed)
Pt called to get xray results

## 2020-09-13 NOTE — Telephone Encounter (Signed)
Spoke with patient about his results. See result note.

## 2020-09-14 ENCOUNTER — Encounter (INDEPENDENT_AMBULATORY_CARE_PROVIDER_SITE_OTHER): Payer: Medicare Other | Admitting: Ophthalmology

## 2020-09-14 ENCOUNTER — Other Ambulatory Visit: Payer: Self-pay

## 2020-09-14 DIAGNOSIS — H43813 Vitreous degeneration, bilateral: Secondary | ICD-10-CM

## 2020-09-14 DIAGNOSIS — H33302 Unspecified retinal break, left eye: Secondary | ICD-10-CM

## 2020-09-14 DIAGNOSIS — H2513 Age-related nuclear cataract, bilateral: Secondary | ICD-10-CM | POA: Diagnosis not present

## 2020-09-14 DIAGNOSIS — H353122 Nonexudative age-related macular degeneration, left eye, intermediate dry stage: Secondary | ICD-10-CM | POA: Diagnosis not present

## 2020-09-14 DIAGNOSIS — I10 Essential (primary) hypertension: Secondary | ICD-10-CM | POA: Diagnosis not present

## 2020-09-14 DIAGNOSIS — H353211 Exudative age-related macular degeneration, right eye, with active choroidal neovascularization: Secondary | ICD-10-CM

## 2020-09-29 ENCOUNTER — Telehealth: Payer: Self-pay | Admitting: Family Medicine

## 2020-09-29 NOTE — Telephone Encounter (Signed)
Roger [name] called in and wanted to know if he has shingles due to he the stinging in his chset and he had a temp 100.3 from 97.8. he took the medication but its not working.

## 2020-09-30 ENCOUNTER — Other Ambulatory Visit: Payer: Self-pay | Admitting: Family Medicine

## 2020-09-30 MED ORDER — GABAPENTIN 100 MG PO CAPS: 100.0000 mg | ORAL_CAPSULE | Freq: Three times a day (TID) | ORAL | 3 refills | Status: DC | PRN

## 2020-09-30 NOTE — Progress Notes (Signed)
Called patient.  Discussed options.  It could have been that he had shingles but did not have a significant rash.  He is not having severe pain but he is having persistent relatively mild discomfort on the right side of the upper abdomen/along the ribs and he feels like it is in the skin and not deep to the tissue.  It does not cross the midline.  He reports trying ibuprofen and also lidocaine patches.  Neither helped.  Discussed options.  Reasonable to try a relatively low dose of gabapentin.  Routine cautions given to patient on medication, especially sedation and balance changes.  He should be able to tolerate it at a low dose.  He can start 100 mg at night and gradually increase the dose if needed/tolerated.  He can update me as needed.  Rationale discussed with patient and he agrees with plan.

## 2020-09-30 NOTE — Telephone Encounter (Signed)
Please check on patient/triage patient and update me about his situation.  Thanks.

## 2020-09-30 NOTE — Telephone Encounter (Signed)
I spoke with pt; pt said today he is having pain in stomach and pt wonders if shingles. Bottom of ribs on rt side still is uncomfortable. Advil tid not helping right side pain. On 09/29/20 S/T and fever but today no fever, 98.0 today and pulse ox is good; pt has not tried OTC lidocaine patch ; pt thought was to take advil. Pt still has stinging sensation on rt side below ribs, no rash and no blisters. Pt said does not have cough and pt sprayed throat with s/t spray and the pain is better. Pt does not have SOB or vomiting and diarrhea. Pt wants to know if there is a med could be called in since pt saw Dr Damita Dunnings about 6 wks ago.pt said pain under rt ribs as the same when saw Dr Damita Dunnings. Pt request cb. [facility] ED  precautions given and pt voiced understanding. Sending note to Dr Damita Dunnings and Janett Billow CMA. Will try also teams to Dr Damita Dunnings.

## 2020-10-01 ENCOUNTER — Ambulatory Visit
Admission: EM | Admit: 2020-10-01 | Discharge: 2020-10-01 | Disposition: A | Discharge status: Home / Self Care | Payer: Medicare Other | Attending: Family Medicine | Admitting: Family Medicine

## 2020-10-01 ENCOUNTER — Encounter: Payer: Self-pay | Admitting: Emergency Medicine

## 2020-10-01 ENCOUNTER — Other Ambulatory Visit: Payer: Self-pay

## 2020-10-01 DIAGNOSIS — J029 Acute pharyngitis, unspecified: Secondary | ICD-10-CM | POA: Diagnosis not present

## 2020-10-01 DIAGNOSIS — J069 Acute upper respiratory infection, unspecified: Secondary | ICD-10-CM

## 2020-10-01 MED ORDER — METHYLPREDNISOLONE SODIUM SUCC 125 MG IJ SOLR
125.0000 mg | Freq: Once | INTRAMUSCULAR | Status: AC
  Administered 2020-10-01: 125 mg via INTRAMUSCULAR

## 2020-10-01 NOTE — Discharge Instructions (Addendum)
Watch your blood sugars. They will temporarily increase because of the shot I gave you today.  You may use over the counter ibuprofen or acetaminophen as needed.  For a sore throat, over the counter products such as [employer] Peroxyl Mouth Sore Rinse or Chloraseptic Sore Throat Spray may provide some temporary relief.  Meds ordered this encounter  Medications   methylPREDNISolone sodium succinate (SOLU-MEDROL) 125 mg/2 mL injection 125 mg

## 2020-10-01 NOTE — ED Triage Notes (Signed)
Patient c/o difficulty swallowing, nasal drainage, cough since Thursday.  Patient has taken Advil.  Patient is vaccinated for COVID.

## 2020-10-01 NOTE — ED Provider Notes (Signed)
[facility]   [id number] 10/01/20 Arrival Time: [id number]  ASSESSMENT & PLAN:  1. Viral URI with cough   2. Sore throat    Discussed typical duration of viral illnesses. OTC symptom care as needed.  Meds ordered this encounter  Medications   methylPREDNISolone sodium succinate (SOLU-MEDROL) 125 mg/2 mL injection 125 mg   Tolerating PO intake without difficulty.   Follow-up Information     Tonia Ghent, MD.   Specialty: Family Medicine Why: If worsening or failing to improve as anticipated. Contact information: [place] [address] [phone]                  Discharge Instructions      Watch your blood sugars. They will temporarily increase because of the shot I gave you today.  You may use over the counter ibuprofen or acetaminophen as needed.  For a sore throat, over the counter products such as Colgate Peroxyl Mouth Sore Rinse or Chloraseptic Sore Throat Spray may provide some temporary relief.  Meds ordered this encounter  Medications   methylPREDNISolone sodium succinate (SOLU-MEDROL) 125 mg/2 mL injection 125 mg          Reviewed expectations re: course of current medical issues. Questions answered. Outlined signs and symptoms indicating need for more acute intervention. Understanding verbalized. After Visit Summary given.   SUBJECTIVE: History from: patient. Roger [name] is a 80 y.o. male who reports ST, nasal congestion/drainage; coughing; abrupt onset; 2-3 d. Denies: fever. Normal PO intake without n/v/d.   OBJECTIVE:  Vitals:   10/01/20 1111  BP: (!) 158/63  Pulse: 97  Temp: 98.6 F (37 C)  TempSrc: Oral  SpO2: 95%    General appearance: alert; no distress Eyes: PERRLA; EOMI; conjunctiva normal HENT: ; AT; with nasal congestion; throat raw and irritated Neck: supple  Lungs: speaks full sentences without difficulty; unlabored Extremities: no edema Skin: warm and  dry Neurologic: normal gait Psychological: alert and cooperative; normal mood and affect   No Known Allergies  Past Medical History:  Diagnosis Date   Acne    Dr. 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[employer])  Tobacco Use   Smoking status: Never   Smokeless tobacco: Never  Substance and Sexual Activity   Alcohol use: Yes    Alcohol/week: 0.0 standard drinks    Comment: rare   Drug use: No   Sexual activity: Not on file  Other Topics Concern   Not on file  Social History Narrative   Regular exercise - walking frequently   Married 1962   Twin daughters, 3 grandkids and 9 great grandkids   Splits time between [place] and [employer]   Working car Press photographer   Social Determinants of Radio broadcast assistant Strain: Not on [employer] Insecurity: Not on file  Transportation Needs: Not on file  Physical Activity: Not on file  Stress: Not on file  Social Connections: Not on file  Intimate Partner Violence: Not on file   Family History  Problem Relation Age of  Onset   Lung cancer Mother        smoker   Colon cancer Neg Hx        none known   Prostate cancer Neg Hx        none known   Past Surgical History:  Procedure Laterality Date   APPENDECTOMY  7th grade   COLONOSCOPY     Melanoma Excision of back  1996   Dr. Allyson Sabal   POLYPECTOMY       Vanessa Kick, MD 10/01/20 [id number]

## 2020-10-03 MED ORDER — LIDOCAINE VISCOUS HCL 2 % MT SOLN: 5.0000 mL | Freq: Four times a day (QID) | OROMUCOSAL | 1 refills | Status: DC | PRN

## 2020-10-03 NOTE — Telephone Encounter (Signed)
Patient called in requesting medication sent in for his Sore Throat . Please Advise

## 2020-10-03 NOTE — Telephone Encounter (Signed)
I would try lidocaine viscous prn for sore throat.  Rx sent.  If progressively worse, SOB, etc, then needs to get rechecked.  Thanks.

## 2020-10-03 NOTE — Telephone Encounter (Signed)
Called patient and reviewed all information. No further questions. Will let us know if any changes in symptoms.

## 2020-10-05 ENCOUNTER — Telehealth: Payer: Self-pay | Admitting: *Deleted

## 2020-10-05 MED ORDER — HYDROCODONE BIT-HOMATROP MBR 5-1.5 MG/5ML PO SOLN: 5.0000 mL | Freq: Three times a day (TID) | ORAL | 0 refills | Status: DC | PRN

## 2020-10-05 NOTE — Telephone Encounter (Signed)
Patient notified as instructed by telephone and verbalized understanding. Patient stated that he would like something to help with his cough at night Patient was given ER precautions and he verbalized understanding. Patient was given CDC's recommendations on isolation.  [place]

## 2020-10-05 NOTE — Telephone Encounter (Signed)
Unfortunately outside of window for antiviral treatment.  Recommend plain mucinex with plenty of water to break up mucous, recommend tylenol for sore throat. If cough is keeping him up at night, would offer Rx cough medicine for night time.

## 2020-10-05 NOTE — Telephone Encounter (Signed)
Sent. Thanks.   

## 2020-10-05 NOTE — Telephone Encounter (Signed)
Returned Mrs. Lehew's call because she had  called access nurse about herself.  Patient's wife stated that someone also needed to talk with her husband. Patient got on the phone and stated that he went to urgent care last week and was given a shot. Patient stated that they did not test him for covid. Patient stated that he did a home covid test today and it was positive. Patient stated that his symptoms started last Thursday. Patient stated that he has a sore throat and a productive cough that is green. Patient denies a fever or SOB. Patient stated that he needs something for his sore throat and to break the stuff up in his chest. Patient declined a virtual visit stating that he wanted this message to go to Dr Damita Dunnings. Patient stated that he had talked with Dr. Damita Dunnings last week about his symptoms. Patient stated the medication that Dr. Damita Dunnings sent in for his throat has not helped. Patient was given ER precautions and he verbalized understanding.  [place]

## 2020-10-05 NOTE — Telephone Encounter (Signed)
Agree with Dr. Darnell Level.  Please see below and let me know if I need to send in rx for cough.  Thanks.

## 2020-10-05 NOTE — Telephone Encounter (Signed)
Pt called he is still coughing up mucus and would like something called in to help also he would like a call back

## 2020-10-06 NOTE — Telephone Encounter (Signed)
Patient notified rx was sent 

## 2020-10-06 NOTE — Telephone Encounter (Signed)
See other TE. Rx was sent in for patient.

## 2020-10-26 ENCOUNTER — Other Ambulatory Visit: Payer: Self-pay

## 2020-10-26 ENCOUNTER — Encounter (INDEPENDENT_AMBULATORY_CARE_PROVIDER_SITE_OTHER): Payer: Medicare Other | Admitting: Ophthalmology

## 2020-10-26 DIAGNOSIS — H43813 Vitreous degeneration, bilateral: Secondary | ICD-10-CM | POA: Diagnosis not present

## 2020-10-26 DIAGNOSIS — H33302 Unspecified retinal break, left eye: Secondary | ICD-10-CM

## 2020-10-26 DIAGNOSIS — H2513 Age-related nuclear cataract, bilateral: Secondary | ICD-10-CM | POA: Diagnosis not present

## 2020-10-26 DIAGNOSIS — H353211 Exudative age-related macular degeneration, right eye, with active choroidal neovascularization: Secondary | ICD-10-CM | POA: Diagnosis not present

## 2020-10-26 DIAGNOSIS — H353122 Nonexudative age-related macular degeneration, left eye, intermediate dry stage: Secondary | ICD-10-CM | POA: Diagnosis not present

## 2020-11-28 DIAGNOSIS — L710 Perioral dermatitis: Secondary | ICD-10-CM | POA: Diagnosis not present

## 2020-11-28 DIAGNOSIS — D225 Melanocytic nevi of trunk: Secondary | ICD-10-CM | POA: Diagnosis not present

## 2020-11-28 DIAGNOSIS — L814 Other melanin hyperpigmentation: Secondary | ICD-10-CM | POA: Diagnosis not present

## 2020-11-28 DIAGNOSIS — Z85820 Personal history of malignant melanoma of skin: Secondary | ICD-10-CM | POA: Diagnosis not present

## 2020-11-28 DIAGNOSIS — L02821 Furuncle of head [any part, except face]: Secondary | ICD-10-CM | POA: Diagnosis not present

## 2020-11-28 DIAGNOSIS — Z85828 Personal history of other malignant neoplasm of skin: Secondary | ICD-10-CM | POA: Diagnosis not present

## 2020-11-28 DIAGNOSIS — L821 Other seborrheic keratosis: Secondary | ICD-10-CM | POA: Diagnosis not present

## 2020-11-28 DIAGNOSIS — Z08 Encounter for follow-up examination after completed treatment for malignant neoplasm: Secondary | ICD-10-CM | POA: Diagnosis not present

## 2020-11-30 ENCOUNTER — Encounter (INDEPENDENT_AMBULATORY_CARE_PROVIDER_SITE_OTHER): Payer: Medicare Other | Admitting: Ophthalmology

## 2020-11-30 ENCOUNTER — Other Ambulatory Visit: Payer: Self-pay

## 2020-11-30 DIAGNOSIS — H33302 Unspecified retinal break, left eye: Secondary | ICD-10-CM

## 2020-11-30 DIAGNOSIS — H43813 Vitreous degeneration, bilateral: Secondary | ICD-10-CM

## 2020-11-30 DIAGNOSIS — H353122 Nonexudative age-related macular degeneration, left eye, intermediate dry stage: Secondary | ICD-10-CM

## 2020-11-30 DIAGNOSIS — H353211 Exudative age-related macular degeneration, right eye, with active choroidal neovascularization: Secondary | ICD-10-CM

## 2020-12-22 ENCOUNTER — Telehealth: Payer: Self-pay | Admitting: Family Medicine

## 2020-12-22 NOTE — Telephone Encounter (Signed)
Patient scheduled for 12/26/20 at 2:00 pm

## 2020-12-22 NOTE — Telephone Encounter (Signed)
MRI may not be the way to go here.  Rec OV, esp if going on for 2 months.  We may need to send him for alternative imaging.  Thanks.

## 2020-12-22 NOTE — Telephone Encounter (Signed)
[facility] Night - Client Nonclinical Telephone Record  [facility] Client [facility] Night - Client Client Site [facility] - Night Provider Renford Dills - MD Contact Type Call Who Is Calling Patient / Member / Family / Caregiver Caller Name [place] Phone Number [phone] Patient Name Roger [name] Patient DOB [birth year] Call Type Message Only Information Provided Reason for Call Request to Schedule Office Appointment Initial Comment Caller states his doctor had him get an x ray before and he needs and mri and he is still having that pain. Caller states he is feeling pain on the right side of his chest. Patient request to speak to RN No Additional Comment Decline triage . Office hours provided. Disp. Time Disposition Final User 12/22/2020 7:57:05 AM General Information Provided Yes Cathlean Sauer Call Closed By: Cathlean Sauer Transaction Date/Time: 12/22/2020 7:50:39 AM (ET

## 2020-12-22 NOTE — Telephone Encounter (Signed)
I spoke with pt; pt said he is having the same pain that he had when he saw you  09/05/20. Pt said the ibuprofen did not help at all but pt did not call back to FU until now. Pt said he has pain lower rt ribs at abd on and off. Not having pain now. Pt cannot describe if when has pain is sharpe or dull. Pt said there is no pattern when pain starts. No fever  and no N&V.Pt said he does not want another appt to be seen because the pain is in the same place it was in 08/2020 and pt is going out of town the first of Jan 2023 for 2 months and pt wants to know if there is anything wrong before he leaves town. Pt said he wants MRI done to see what is going on. Pt wants MRI [address] either 12/28/20 or 12/29/20. Pt request cb with MRI appt.sending note to Dr Damita Dunnings and Janett Billow CMA.

## 2020-12-22 NOTE — Telephone Encounter (Signed)
Pt called in requesting to get a MRI done of his chest and stomach would like it to be done at the [facility] location next week  would like a call back # [phone] .Please Advise

## 2020-12-26 ENCOUNTER — Encounter: Payer: Self-pay | Admitting: Family Medicine

## 2020-12-26 ENCOUNTER — Ambulatory Visit (INDEPENDENT_AMBULATORY_CARE_PROVIDER_SITE_OTHER): Payer: Medicare Other | Admitting: Family Medicine

## 2020-12-26 ENCOUNTER — Other Ambulatory Visit: Payer: Self-pay

## 2020-12-26 VITALS — BP 130/70 | HR 91 | Temp 97.8°F | Ht 66.0 in | Wt 179.0 lb

## 2020-12-26 DIAGNOSIS — Z23 Encounter for immunization: Secondary | ICD-10-CM | POA: Diagnosis not present

## 2020-12-26 DIAGNOSIS — R0789 Other chest pain: Secondary | ICD-10-CM

## 2020-12-26 NOTE — Progress Notes (Signed)
This visit occurred during the SARS-CoV-2 public health emergency.  Safety protocols were in place, including screening questions prior to the visit, additional usage of staff PPE, and extensive cleaning of exam room while observing appropriate contact time as indicated for disinfecting solutions.  R sided chest wall discomfort.  Still with episodic pain "that could be a stinging pain."  When it happens it is a "nuisance but not a bad pain."  No L sided sx.  Not in the abd.  No rash, no bruise.  Doesn't wrap around the chest.  No pain with ROM.  No pain with deep breath.  No pain at time of exam.  He took some tylenol, didn't try gabapentin in the meantime.  He notes it sporadically but it may go for days w/o troubles.  No fevers, chills, vomiting, sputum.  He doesn't note changes with meals.  Never smoker.  The episodes, when noted, are less pronounced and happening less overall.  Recheck BP wnl.    Meds, vitals, and allergies reviewed.   ROS: Per HPI unless specifically indicated in ROS section   GEN: nad, alert and oriented HEENT: ncat NECK: supple w/o LA CV: rrr. Chest wall nontender.  No rash.   PULM: ctab, no inc wob ABD: soft, +bs, right upper quadrant nontender. EXT: no edema SKIN: no acute rash

## 2020-12-26 NOTE — Patient Instructions (Signed)
Since this is gradually getting less pronounced and happening less often, I suspect it will gradually resolve.  It may have been a muscle strain in the chest wall, between the ribs.  That should gradually get better.   Take care.  Update me as needed.  Glad to see you.

## 2020-12-28 DIAGNOSIS — R0789 Other chest pain: Secondary | ICD-10-CM | POA: Insufficient documentation

## 2020-12-28 NOTE — Assessment & Plan Note (Signed)
Since this is gradually getting less pronounced and happening less often, I suspect it will gradually resolve.  It may have been a muscle strain in the chest wall, between the ribs.  That should gradually get better.  I asked him to update me as needed.  I do not suspect an ominous diagnosis and we both agreed that given his improvement it was reasonable to defer further imaging.

## 2021-01-04 ENCOUNTER — Encounter (INDEPENDENT_AMBULATORY_CARE_PROVIDER_SITE_OTHER): Payer: Medicare Other | Admitting: Ophthalmology

## 2021-01-04 ENCOUNTER — Other Ambulatory Visit: Payer: Self-pay

## 2021-01-04 DIAGNOSIS — H353122 Nonexudative age-related macular degeneration, left eye, intermediate dry stage: Secondary | ICD-10-CM | POA: Diagnosis not present

## 2021-01-04 DIAGNOSIS — H43813 Vitreous degeneration, bilateral: Secondary | ICD-10-CM

## 2021-01-04 DIAGNOSIS — H353211 Exudative age-related macular degeneration, right eye, with active choroidal neovascularization: Secondary | ICD-10-CM | POA: Diagnosis not present

## 2021-01-04 DIAGNOSIS — H2513 Age-related nuclear cataract, bilateral: Secondary | ICD-10-CM

## 2021-03-27 ENCOUNTER — Encounter (INDEPENDENT_AMBULATORY_CARE_PROVIDER_SITE_OTHER): Payer: Medicare Other | Admitting: Ophthalmology

## 2021-03-27 ENCOUNTER — Telehealth: Payer: Self-pay

## 2021-03-27 DIAGNOSIS — G4733 Obstructive sleep apnea (adult) (pediatric): Secondary | ICD-10-CM

## 2021-03-27 DIAGNOSIS — H353132 Nonexudative age-related macular degeneration, bilateral, intermediate dry stage: Secondary | ICD-10-CM | POA: Diagnosis not present

## 2021-03-27 NOTE — Telephone Encounter (Signed)
Patient needing referral for new sleep study. Does patient need to be seen or can referral be sented. Please advise, patient wants to be referred Roger [name] with [phone]

## 2021-03-28 NOTE — Telephone Encounter (Signed)
Notified patient that referral was done.  ?

## 2021-03-28 NOTE — Telephone Encounter (Signed)
If he is going to need a completely new sleep study then that normally comes through pulmonary.  He does not need an office visit with me but I put in the referral.  Thanks. ?

## 2021-04-03 DIAGNOSIS — H353122 Nonexudative age-related macular degeneration, left eye, intermediate dry stage: Secondary | ICD-10-CM | POA: Diagnosis not present

## 2021-04-03 DIAGNOSIS — H35453 Secondary pigmentary degeneration, bilateral: Secondary | ICD-10-CM | POA: Diagnosis not present

## 2021-04-03 DIAGNOSIS — H353211 Exudative age-related macular degeneration, right eye, with active choroidal neovascularization: Secondary | ICD-10-CM | POA: Diagnosis not present

## 2021-04-03 DIAGNOSIS — H5315 Visual distortions of shape and size: Secondary | ICD-10-CM | POA: Diagnosis not present

## 2021-04-03 DIAGNOSIS — H35363 Drusen (degenerative) of macula, bilateral: Secondary | ICD-10-CM | POA: Diagnosis not present

## 2021-04-20 DIAGNOSIS — G4733 Obstructive sleep apnea (adult) (pediatric): Secondary | ICD-10-CM | POA: Diagnosis not present

## 2021-04-24 ENCOUNTER — Encounter: Payer: Self-pay | Admitting: Family Medicine

## 2021-04-24 ENCOUNTER — Ambulatory Visit (INDEPENDENT_AMBULATORY_CARE_PROVIDER_SITE_OTHER): Payer: Medicare Other | Admitting: Family Medicine

## 2021-04-24 VITALS — BP 122/64 | HR 74 | Temp 98.1°F | Ht 66.0 in | Wt 177.0 lb

## 2021-04-24 DIAGNOSIS — E119 Type 2 diabetes mellitus without complications: Secondary | ICD-10-CM

## 2021-04-24 LAB — POCT GLYCOSYLATED HEMOGLOBIN (HGB A1C): Hemoglobin A1C: 7.6 % — AB (ref 4.0–5.6)

## 2021-04-24 NOTE — Progress Notes (Signed)
Diabetes:  ?No meds ?Hypoglycemic episodes: no sx ?Hyperglycemic episodes: no sx.  ?Feet problems: no ?Blood Sugars averaging: rarely checked.   ?eye exam within last year: yes ?D/w pt about diet and exercise.  He is trying to walk daily.   ?A1c 7.6.  d/w pt about results. ?No CP, SOB, BLE edema.   ? ?He is back from [place], living in [place] in the meantime.   ? ?He is going to f/u re: CPAP.  He saw a clinic in [facility], using new equipment.  I'll defer.  He agrees.   ? ?D/w pt about shingrix and insurance coverage, ie checking to see if it is covered.   ? ?Meds, vitals, and allergies reviewed.  ?ROS: Per HPI unless specifically indicated in ROS section  ? ?GEN: nad, alert and oriented ?HEENT: ncat ?NECK: supple w/o LA ?CV: rrr. ?PULM: ctab, no inc wob ?ABD: soft, +bs ?EXT: no edema ?SKIN: no acute rash ? ?Diabetic foot exam: ?Normal inspection ?No skin breakdown ?No calluses  ?Normal DP pulses ?Normal sensation to light touch and monofilament ?Nails normal ? ?21 minutes were devoted to patient care in this encounter (this includes time spent reviewing the patient's file/history, interviewing and examining the patient, counseling/reviewing plan with patient).  ?

## 2021-04-24 NOTE — Patient Instructions (Addendum)
Take care.  Glad to see you. ?Plan on recheck in about 5-6 months at a yearly visit.  Labs ahead of time if possible or we can do them at the visit.  ?

## 2021-04-26 NOTE — Assessment & Plan Note (Signed)
A1c up to 7.6.  Discussed with patient about diet and exercise.  We can recheck periodically.  He is going to try walking more and that should help. ?

## 2021-05-15 ENCOUNTER — Encounter (HOSPITAL_BASED_OUTPATIENT_CLINIC_OR_DEPARTMENT_OTHER): Payer: Self-pay

## 2021-05-15 DIAGNOSIS — G4733 Obstructive sleep apnea (adult) (pediatric): Secondary | ICD-10-CM

## 2021-05-15 DIAGNOSIS — H353122 Nonexudative age-related macular degeneration, left eye, intermediate dry stage: Secondary | ICD-10-CM | POA: Diagnosis not present

## 2021-05-15 DIAGNOSIS — H35362 Drusen (degenerative) of macula, left eye: Secondary | ICD-10-CM | POA: Diagnosis not present

## 2021-05-15 DIAGNOSIS — H353211 Exudative age-related macular degeneration, right eye, with active choroidal neovascularization: Secondary | ICD-10-CM | POA: Diagnosis not present

## 2021-05-15 DIAGNOSIS — H35452 Secondary pigmentary degeneration, left eye: Secondary | ICD-10-CM | POA: Diagnosis not present

## 2021-05-16 ENCOUNTER — Ambulatory Visit (HOSPITAL_BASED_OUTPATIENT_CLINIC_OR_DEPARTMENT_OTHER): Payer: Medicare Other | Attending: Internal Medicine | Admitting: Internal Medicine

## 2021-05-16 VITALS — Ht 66.0 in | Wt 174.0 lb

## 2021-05-16 DIAGNOSIS — G4733 Obstructive sleep apnea (adult) (pediatric): Secondary | ICD-10-CM

## 2021-05-23 DIAGNOSIS — G4733 Obstructive sleep apnea (adult) (pediatric): Secondary | ICD-10-CM | POA: Diagnosis not present

## 2021-05-23 NOTE — Procedures (Signed)
`` ? ? ?  Patient Name: Roger [name], Roger [name] ?Study Date: 05/16/2021 ?Gender: Male ?D.O.B: December 10, 1940 ?Age (years): 78 ?Referring Provider: Baird Lyons MD, ABSM ?Height (inches): 66 ?Interpreting Physician: Baird Lyons MD, ABSM ?Weight (lbs): 174 ?RPSGT: Carolin Coy ?BMI: 28 ?MRN: [id number] ?Neck Size: 16.00 ? ?CLINICAL INFORMATION ?The patient is referred for an adaptive servo-ventilator titration study. ?MEDICATIONS ?Medications self-administered by patient taken the night of the study : none reported ? ?SLEEP STUDY TECHNIQUE ?As per the AASM Manual for the Scoring of Sleep and Associated Events v2.3 (April 2016) with a hypopnea requiring 4% desaturations. ? ?The channels recorded and monitored were frontal, central and occipital EEG, electrooculogram (EOG), submentalis EMG (chin), nasal and oral airflow, thoracic and abdominal wall motion, anterior tibialis EMG, snore microphone, electrocardiogram, and pulse oximetry. ? ?RESPIRATORY PARAMETERS ?Optimal Min IPAP (cm): 5 Optimal Max IPAP (cm): 13 Optimal Min EPAP (cm): 5 Optimal Max EPAP (cm): 13 ?Optimal Max Pressure (cm): 12 Optimal Min PS (cm): 6 Optimal Max PS (cm): 12 Opitmal Breathing Rate (/min): Auto ?Overall Min O2 (%): 90.0 Min O2 at Optimal Pressure (%): 90.0 AHI at Optimal (/hr): N/A  ? ?SLEEP ARCHITECTURE ?During a recording time of 394.6 minutes, the patient slept for 271.5 minutes. Sleep efficiency was 68.8%%. The patient spent 31.3%% of the night in stage N1 sleep, 62.1%% in stage N2 sleep, 0.0%% in stage N3 and 6.6% in REM. Wake after sleep onset (WASO) was 119.0 minutes. Alpha intrusion was absent. Supine sleep was 88.95%. The arousal index was 56.1. ? ?LEG MOVEMENT DATA ?PLM Index (/hr): 0.0 PLM Arousal Index (/hr): 0.0 ? ?CARDIAC DATA ?The 2 lead EKG demonstrated sinus rhythm. The mean heart rate was 61.4 beats per minute. Other EKG findings include: PACs. ? ?IMPRESSIONS ?- Moderate baseline obstructive sleep apnea occurred during this study  (AHI = 28.5/hour).  ?- ASV was titrated to EPAP 13, Max PS 12, Min PS 6 with no residual snoring. Patient woke around 03:45, unable to regain sleep, so final titration may be incomplete. ?- No significant central sleep apnea occurred during this study (CAI = 4.4). ?- The patient had minimal or no oxygen desaturation during the study (Min O2 = 90.0). Minimal O2 saturation on ASV 13/6/12 was 94%. ?- The patient snored with soft snoring volume. ?- Occasional PACs were noted during this study. ?- Clinically significant periodic limb movements did not occur during sleep. ? ?DIAGNOSIS ?- Obstructive Sleep Apnea (G47.33) ? ?RECOMMENDATIONS ?- Trial of SV Advance EPAP Min 5 and Max 13 cmH2O, Pressure Support Min 6 and Max 12 cmH2O with Max Pressure of 12 cmH2O and Breath Rate of Auto BrPM ?- Be careful with alcohol, sedatives and other CNS depressants that may worsen sleep apnea and disrupt normal sleep architecture. ?- Sleep hygiene should be reviewed to assess factors that may improve sleep quality. ?- Weight management and regular exercise should be initiated or continued. ? ?[Electronically signed] 05/23/2021 11:58 AM ? ?Baird Lyons MD, ABSM ?Diplomate, Tax adviser of Sleep Medicine ?NPI: [id number] ? ?  ? ? ? ? ? ? ? ? ? ? ? ? ? ? ? ? ? ? ? ? ? ?Roger [name] ?Diplomate, Tax adviser of Sleep Medicine ? ?ELECTRONICALLY SIGNED ON:  05/23/2021, 11:43 AM ?[place] ?PH: [phone]   FX: [phone] ?ACCREDITED BY THE AMERICAN ACADEMY OF SLEEP MEDICINE ?

## 2021-05-29 DIAGNOSIS — D225 Melanocytic nevi of trunk: Secondary | ICD-10-CM | POA: Diagnosis not present

## 2021-05-29 DIAGNOSIS — L814 Other melanin hyperpigmentation: Secondary | ICD-10-CM | POA: Diagnosis not present

## 2021-05-29 DIAGNOSIS — L821 Other seborrheic keratosis: Secondary | ICD-10-CM | POA: Diagnosis not present

## 2021-06-12 DIAGNOSIS — H353211 Exudative age-related macular degeneration, right eye, with active choroidal neovascularization: Secondary | ICD-10-CM | POA: Diagnosis not present

## 2021-06-26 DIAGNOSIS — L298 Other pruritus: Secondary | ICD-10-CM | POA: Diagnosis not present

## 2021-06-26 DIAGNOSIS — L570 Actinic keratosis: Secondary | ICD-10-CM | POA: Diagnosis not present

## 2021-06-26 DIAGNOSIS — D485 Neoplasm of uncertain behavior of skin: Secondary | ICD-10-CM | POA: Diagnosis not present

## 2021-06-26 DIAGNOSIS — R208 Other disturbances of skin sensation: Secondary | ICD-10-CM | POA: Diagnosis not present

## 2021-06-26 DIAGNOSIS — L538 Other specified erythematous conditions: Secondary | ICD-10-CM | POA: Diagnosis not present

## 2021-06-26 DIAGNOSIS — L820 Inflamed seborrheic keratosis: Secondary | ICD-10-CM | POA: Diagnosis not present

## 2021-07-13 DIAGNOSIS — G4733 Obstructive sleep apnea (adult) (pediatric): Secondary | ICD-10-CM | POA: Diagnosis not present

## 2021-07-19 DIAGNOSIS — H353211 Exudative age-related macular degeneration, right eye, with active choroidal neovascularization: Secondary | ICD-10-CM | POA: Diagnosis not present

## 2021-08-23 DIAGNOSIS — H353211 Exudative age-related macular degeneration, right eye, with active choroidal neovascularization: Secondary | ICD-10-CM | POA: Diagnosis not present

## 2021-09-11 ENCOUNTER — Ambulatory Visit (INDEPENDENT_AMBULATORY_CARE_PROVIDER_SITE_OTHER): Payer: Medicare Other

## 2021-09-11 VITALS — Ht 66.0 in | Wt 177.0 lb

## 2021-09-11 DIAGNOSIS — Z0000 Encounter for general adult medical examination without abnormal findings: Secondary | ICD-10-CM

## 2021-09-11 NOTE — Progress Notes (Signed)
Virtual Visit via Telephone Note  I connected with  Roger [name] on 09/11/21 at 11:00 AM EDT by telephone and verified that I am speaking with the correct person using two identifiers.  Location: Patient: home Provider: [place]  Persons participating in the virtual visit: [place]   I discussed the limitations, risks, security and privacy concerns of performing an evaluation and management service by telephone and the availability of in person appointments. The patient expressed understanding and agreed to proceed.  Interactive audio and video telecommunications were attempted between this nurse and patient, however failed, due to patient having technical difficulties OR patient did not have access to video capability.  We continued and completed visit with audio only.  Some vital signs may be absent or patient reported.   Roger David, LPN  Subjective:   Roger [name] is a 81 y.o. male who presents for Medicare Annual/Subsequent preventive examination.  Review of Systems     Cardiac Risk Factors include: advanced age (>64mn, >>67women);dyslipidemia;male gender     Objective:    There were no vitals filed for this visit. There is no height or weight on file to calculate BMI.     09/11/2021   11:01 AM 05/16/2021    8:27 PM 07/30/2019    9:51 AM 06/02/2015    1:14 PM  Advanced Directives  Does Patient Have a Medical Advance Directive? No No No Yes  Type of AScientist, research (medical)Living will  Does patient want to make changes to medical advance directive?    No - Patient declined  Copy of [place] Chart?    No - copy requested  Would patient like information on creating a medical advance directive? No - Patient declined No - Patient declined No - Patient declined     Current Medications (verified) Outpatient Encounter Medications as of 09/11/2021  Medication Sig   Multiple  Vitamins-Minerals (PRESERVISION AREDS 2+MULTI VIT PO) Take by mouth 2 (two) times daily.   pravastatin (PRAVACHOL) 20 MG tablet Take 1 tablet (20 mg total) by mouth daily.   No facility-administered encounter medications on file as of 09/11/2021.    Allergies (verified) Patient has no known allergies.   History: Past Medical History:  Diagnosis Date   Acne    Dr. LAllyson Sabal  Diabetes mellitus    h/o Type 2/no meds   Hyperlipidemia    Skin cancer    on back   Past Surgical History:  Procedure Laterality Date   APPENDECTOMY  7th grade   COLONOSCOPY     Melanoma Excision of back  1996   Dr. 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[employer])  Tobacco Use   Smoking status: Never   Smokeless tobacco: Never  Substance and Sexual Activity   Alcohol use: Yes  Alcohol/week: 0.0 standard drinks of alcohol    Comment: rare   Drug use: No   Sexual activity: Not on file  Other Topics Concern   Not on file  Social History Narrative   Regular exercise - walking frequently   Married 1962   Twin daughters, 3 grandkids and 9 great grandkids   Splits time between [place] and [employer]   Working car Press photographer   Social Determinants of Health   Financial Resource Strain: Low Risk  (09/11/2021)   Overall Financial Resource Strain (CARDIA)    Difficulty of Paying Living Expenses: Not hard at all  Food Insecurity: No Food Insecurity (09/11/2021)   Hunger Vital Sign    Worried About Running Out of Food in the Last Year: Never true    [place] in the Last Year:  Never true  Transportation Needs: No Transportation Needs (09/11/2021)   PRAPARE - Hydrologist (Medical): No    Lack of Transportation (Non-Medical): No  Physical Activity: Insufficiently Active (09/11/2021)   Exercise Vital Sign    Days of Exercise per Week: 3 days    Minutes of Exercise per Session: 30 min  Stress: No Stress Concern Present (09/11/2021)   [place]    Feeling of Stress : Not at all  Social Connections: Moderately Integrated (09/11/2021)   Social Connection and Isolation Panel [NHANES]    Frequency of Communication with Friends and Family: More than three times a week    Frequency of Social Gatherings with Friends and Family: More than three times a week    Attends Religious Services: More than 4 times per year    Active Member of [employer] or Organizations: No    Attends Music therapist: Never    Marital Status: Married    Tobacco Counseling Counseling given: Not Answered   Clinical Intake:  Pre-visit preparation completed: Yes  Pain : No/denies pain     Nutritional Risks: None Diabetes: No  How often do you need to have someone help you when you read instructions, pamphlets, or other written materials from your doctor or pharmacy?: 1 - Never  Diabetic?no  Interpreter Needed?: No  Information entered by :: Kirke Shaggy, LPN   Activities of Daily Living    09/11/2021   11:02 AM  In your present state of health, do you have any difficulty performing the following activities:  Hearing? 0  Vision? 0  Difficulty concentrating or making decisions? 0  Walking or climbing stairs? 0  Dressing or bathing? 0  Doing errands, shopping? 0  Preparing Food and eating ? N  Using the Toilet? N  In the past six months, have you accidently leaked urine? N  Do you have problems with loss of bowel control? N  Managing your Medications? N  Managing your  Finances? N  Housekeeping or managing your Housekeeping? N    Patient Care Team: Tonia Ghent, MD as PCP - General Jola Baptist, [place] as Referring Physician (Chiropractic Medicine) [place], Heather, OD (Optometry) Hayden Pedro, MD as Consulting Physician (Ophthalmology)  Indicate any recent Medical Services you may have received from other than [facility] providers in the past year (date may be approximate).     Assessment:   This is a routine wellness examination for Roger [name].  Hearing/Vision screen Hearing Screening - Comments:: Wears aids Vision Screening - Comments:: Readers- Dr.Omar  Dietary issues and exercise activities discussed: Current Exercise Habits:  Home exercise routine, Type of exercise: walking, Time (Minutes): 30, Frequency (Times/Week): 3, Weekly Exercise (Minutes/Week): 90, Intensity: Mild   Goals Addressed             This Visit's Progress    DIET - EAT MORE FRUITS AND VEGETABLES         Depression Screen    09/11/2021   10:59 AM 12/26/2020    2:13 PM 12/07/2019    8:17 AM 07/30/2019    9:53 AM 06/26/2018    9:01 AM 02/25/2017   11:51 AM 12/19/2015    3:57 PM  PHQ 2/9 Scores  PHQ - 2 Score 0 0 0 0 0 0 0  PHQ- 9 Score 0   0       Fall Risk    09/11/2021   11:02 AM 09/05/2020    8:29 AM 12/07/2019    8:17 AM 08/03/2019    8:14 AM 07/30/2019    9:53 AM  Fall Risk   Falls in the past year? 0 0 0 0 0  Number falls in past yr: 0 0 0 0 0  Injury with Fall? 0 0 0 0 0  Risk for fall due to : No Fall Risks No Fall Risks   No Fall Risks  Follow up Falls evaluation completed Falls evaluation completed Falls evaluation completed  Falls prevention discussed;Falls evaluation completed    FALL RISK PREVENTION PERTAINING TO THE HOME:  Any stairs in or around the home? No  If so, are there any without handrails? No  Home free of loose throw rugs in walkways, pet beds, electrical cords, etc? No  Adequate lighting in your home to reduce risk of falls? No    ASSISTIVE DEVICES UTILIZED TO PREVENT FALLS:  Life alert? No  Use of a cane, walker or w/c? No  Grab bars in the bathroom? No  Shower chair or bench in shower? No  Elevated toilet seat or a handicapped toilet? Yes   Cognitive Function:    07/30/2019    9:56 AM  MMSE - Mini Mental State Exam  Not completed: Refused        09/11/2021   11:02 AM  6CIT Screen  What Year? 0 points  What month? 0 points  What time? 0 points  Count back from 20 0 points  Months in reverse 0 points  Repeat phrase 0 points  Total Score 0 points    Immunizations Immunization History  Administered Date(s) Administered   Fluad Quad(high Dose 65+) 01/05/2019, 12/07/2019, 12/26/2020   Influenza Whole 10/04/2009   Influenza,inj,Quad PF,6+ Mos 10/06/2013, 12/13/2014, 12/19/2015, 11/26/2017   Influenza-Unspecified 10/07/2015   PFIZER(Purple Top)SARS-COV-2 Vaccination 04/13/2019, 05/06/2019   Pneumococcal Conjugate-13 12/13/2014   Pneumococcal Polysaccharide-23 09/15/2012   Zoster, Live 09/04/2011    TDAP status: Due, Education has been provided regarding the importance of this vaccine. Advised may receive this vaccine at local pharmacy or Health Dept. Aware to provide a copy of the vaccination record if obtained from local pharmacy or Health Dept. Verbalized acceptance and understanding.  Flu Vaccine status: Up to date  Pneumococcal vaccine status: Up to date  Covid-19 vaccine status: Completed vaccines  Qualifies for Shingles Vaccine? Yes   Zostavax completed Yes   Shingrix Completed?: No.    Education has been provided regarding the importance of this vaccine. Patient has been advised to call insurance company to determine out of pocket expense if they have not yet received this vaccine. Advised may also receive vaccine at local pharmacy  or Health Dept. Verbalized acceptance and understanding.  Screening Tests Health Maintenance  Topic Date Due   Hepatitis C Screening  Never done   Zoster  Vaccines- Shingrix (1 of 2) Never done   COVID-19 Vaccine (3 - Pfizer risk series) 06/03/2019   OPHTHALMOLOGY EXAM  08/10/2021   INFLUENZA VACCINE  08/22/2021   URINE MICROALBUMIN  09/05/2021   TETANUS/TDAP  07/29/2024 (Originally 06/08/1959)   HEMOGLOBIN A1C  10/24/2021   FOOT EXAM  04/25/2022   Pneumonia Vaccine 62+ Years old  Completed   HPV VACCINES  Aged Out    Health Maintenance  Health Maintenance Due  Topic Date Due   Hepatitis C Screening  Never done   Zoster Vaccines- Shingrix (1 of 2) Never done   COVID-19 Vaccine (3 - Pfizer risk series) 06/03/2019   OPHTHALMOLOGY EXAM  08/10/2021   INFLUENZA VACCINE  08/22/2021   URINE MICROALBUMIN  09/05/2021    Colorectal cancer screening: No longer required.   Lung Cancer Screening: (Low Dose CT Chest recommended if Age 79-80 years, 30 pack-year currently smoking OR have quit w/in 15years.) does not qualify.   Additional Screening:  Hepatitis C Screening: does qualify; Completed no  Vision Screening: Recommended annual ophthalmology exams for early detection of glaucoma and other disorders of the eye. Is the patient up to date with their annual eye exam?  Yes  Who is the provider or what is the name of the office in which the patient attends annual eye exams? Dr.Omar If pt is not established with a provider, would they like to be referred to a provider to establish care? No .   Dental Screening: Recommended annual dental exams for proper oral hygiene  Community Resource Referral / Chronic Care Management: CRR required this visit?  No   CCM required this visit?  No      Plan:     I have personally reviewed and noted the following in the patient's chart:   Medical and social history Use of alcohol, tobacco or illicit drugs  Current medications and supplements including opioid prescriptions. Patient is not currently taking opioid prescriptions. Functional ability and status Nutritional status Physical  activity Advanced directives List of other physicians Hospitalizations, surgeries, and ER visits in previous 12 months Vitals Screenings to include cognitive, depression, and falls Referrals and appointments  In addition, I have reviewed and discussed with patient certain preventive protocols, quality metrics, and best practice recommendations. A written personalized care plan for preventive services as well as general preventive health recommendations were provided to patient.     Roger David, LPN   04/30/8117   Nurse Notes: none

## 2021-09-11 NOTE — Patient Instructions (Signed)
Mr. Kann , Thank you for taking time to come for your Medicare Wellness Visit. I appreciate your ongoing commitment to your health goals. Please review the following plan we discussed and let me know if I can assist you in the future.   Screening recommendations/referrals: Colonoscopy: aged out Recommended yearly ophthalmology/optometry visit for glaucoma screening and checkup Recommended yearly dental visit for hygiene and checkup  Vaccinations: Influenza vaccine: 12/26/20 Pneumococcal vaccine: 12/13/14 Tdap vaccine: n/d Shingles vaccine: Zostavax 09/04/11   Covid-19: 04/13/19, 05/06/19  Advanced directives: no  Conditions/risks identified: none  Next appointment: Follow up in one year for your annual wellness visit. 09/13/22 @ 10:30 am by phone  Preventive Care 79 Years and Older, Male Preventive care refers to lifestyle choices and visits with your health care provider that can promote health and wellness. What does preventive care include? A yearly physical exam. This is also called an annual well check. Dental exams once or twice a year. Routine eye exams. Ask your health care provider how often you should have your eyes checked. Personal lifestyle choices, including: Daily care of your teeth and gums. Regular physical activity. Eating a healthy diet. Avoiding tobacco and drug use. Limiting alcohol use. Practicing safe sex. Taking low doses of aspirin every day. Taking vitamin and mineral supplements as recommended by your health care provider. What happens during an annual well check? The services and screenings done by your health care provider during your annual well check will depend on your age, overall health, lifestyle risk factors, and family history of disease. Counseling  Your health care provider may ask you questions about your: Alcohol use. Tobacco use. Drug use. Emotional well-being. Home and relationship well-being. Sexual activity. Eating  habits. History of falls. Memory and ability to understand (cognition). Work and work Statistician. Screening  You may have the following tests or measurements: Height, weight, and BMI. Blood pressure. Lipid and cholesterol levels. These may be checked every 5 years, or more frequently if you are over 71 years old. Skin check. Lung cancer screening. You may have this screening every year starting at age 17 if you have a 30-pack-year history of smoking and currently smoke or have quit within the past 15 years. Fecal occult blood test (FOBT) of the stool. You may have this test every year starting at age 79. Flexible sigmoidoscopy or colonoscopy. You may have a sigmoidoscopy every 5 years or a colonoscopy every 10 years starting at age 92. Prostate cancer screening. Recommendations will vary depending on your family history and other risks. Hepatitis C blood test. Hepatitis B blood test. Sexually transmitted disease (STD) testing. Diabetes screening. This is done by checking your blood sugar (glucose) after you have not eaten for a while (fasting). You may have this done every 1-3 years. Abdominal aortic aneurysm (AAA) screening. You may need this if you are a current or former smoker. Osteoporosis. You may be screened starting at age 79 if you are at high risk. Talk with your health care provider about your test results, treatment options, and if necessary, the need for more tests. Vaccines  Your health care provider may recommend certain vaccines, such as: Influenza vaccine. This is recommended every year. Tetanus, diphtheria, and acellular pertussis (Tdap, Td) vaccine. You may need a Td booster every 10 years. Zoster vaccine. You may need this after age 79. Pneumococcal 13-valent conjugate (PCV13) vaccine. One dose is recommended after age 79. Pneumococcal polysaccharide (PPSV23) vaccine. One dose is recommended after age 79. Talk to your health  care provider about which screenings and  vaccines you need and how often you need them. This information is not intended to replace advice given to you by your health care provider. Make sure you discuss any questions you have with your health care provider. Document Released: 02/04/2015 Document Revised: 09/28/2015 Document Reviewed: 11/09/2014 Elsevier Interactive Patient Education  2017 [employer] Prevention in the Home Falls can cause injuries. They can happen to people of all ages. There are many things you can do to make your home safe and to help prevent falls. What can I do on the outside of my home? Regularly fix the edges of walkways and driveways and fix any cracks. Remove anything that might make you trip as you walk through a door, such as a raised step or threshold. Trim any bushes or trees on the path to your home. Use bright outdoor lighting. Clear any walking paths of anything that might make someone trip, such as rocks or tools. Regularly check to see if handrails are loose or broken. Make sure that both sides of any steps have handrails. Any raised decks and porches should have guardrails on the edges. Have any leaves, snow, or ice cleared regularly. Use sand or salt on walking paths during winter. Clean up any spills in your garage right away. This includes oil or grease spills. What can I do in the bathroom? Use night lights. Install grab bars by the toilet and in the tub and shower. Do not use towel bars as grab bars. Use non-skid mats or decals in the tub or shower. If you need to sit down in the shower, use a plastic, non-slip stool. Keep the floor dry. Clean up any water that spills on the floor as soon as it happens. Remove soap buildup in the tub or shower regularly. Attach bath mats securely with double-sided non-slip rug tape. Do not have throw rugs and other things on the floor that can make you trip. What can I do in the bedroom? Use night lights. Make sure that you have a light by your  bed that is easy to reach. Do not use any sheets or blankets that are too big for your bed. They should not hang down onto the floor. Have a firm chair that has side arms. You can use this for support while you get dressed. Do not have throw rugs and other things on the floor that can make you trip. What can I do in the kitchen? Clean up any spills right away. Avoid walking on wet floors. Keep items that you use a lot in easy-to-reach places. If you need to reach something above you, use a strong step stool that has a grab bar. Keep electrical cords out of the way. Do not use floor polish or wax that makes floors slippery. If you must use wax, use non-skid floor wax. Do not have throw rugs and other things on the floor that can make you trip. What can I do with my stairs? Do not leave any items on the stairs. Make sure that there are handrails on both sides of the stairs and use them. Fix handrails that are broken or loose. Make sure that handrails are as long as the stairways. Check any carpeting to make sure that it is firmly attached to the stairs. Fix any carpet that is loose or worn. Avoid having throw rugs at the top or bottom of the stairs. If you do have throw rugs, attach them to  the floor with carpet tape. Make sure that you have a light switch at the top of the stairs and the bottom of the stairs. If you do not have them, ask someone to add them for you. What else can I do to help prevent falls? Wear shoes that: Do not have high heels. Have rubber bottoms. Are comfortable and fit you well. Are closed at the toe. Do not wear sandals. If you use a stepladder: Make sure that it is fully opened. Do not climb a closed stepladder. Make sure that both sides of the stepladder are locked into place. Ask someone to hold it for you, if possible. Clearly mark and make sure that you can see: Any grab bars or handrails. First and last steps. Where the edge of each step is. Use tools that  help you move around (mobility aids) if they are needed. These include: Canes. Walkers. Scooters. Crutches. Turn on the lights when you go into a dark area. Replace any light bulbs as soon as they burn out. Set up your furniture so you have a clear path. Avoid moving your furniture around. If any of your floors are uneven, fix them. If there are any pets around you, be aware of where they are. Review your medicines with your doctor. Some medicines can make you feel dizzy. This can increase your chance of falling. Ask your doctor what other things that you can do to help prevent falls. This information is not intended to replace advice given to you by your health care provider. Make sure you discuss any questions you have with your health care provider. Document Released: 11/04/2008 Document Revised: 06/16/2015 Document Reviewed: 02/12/2014 Elsevier Interactive Patient Education  2017 [employer].

## 2021-09-26 DIAGNOSIS — H353211 Exudative age-related macular degeneration, right eye, with active choroidal neovascularization: Secondary | ICD-10-CM | POA: Diagnosis not present

## 2021-09-28 DIAGNOSIS — D225 Melanocytic nevi of trunk: Secondary | ICD-10-CM | POA: Diagnosis not present

## 2021-09-28 DIAGNOSIS — L821 Other seborrheic keratosis: Secondary | ICD-10-CM | POA: Diagnosis not present

## 2021-09-28 DIAGNOSIS — L814 Other melanin hyperpigmentation: Secondary | ICD-10-CM | POA: Diagnosis not present

## 2021-09-28 DIAGNOSIS — L281 Prurigo nodularis: Secondary | ICD-10-CM | POA: Diagnosis not present

## 2021-09-28 DIAGNOSIS — L905 Scar conditions and fibrosis of skin: Secondary | ICD-10-CM | POA: Diagnosis not present

## 2021-09-28 DIAGNOSIS — L570 Actinic keratosis: Secondary | ICD-10-CM | POA: Diagnosis not present

## 2021-10-23 DIAGNOSIS — C44729 Squamous cell carcinoma of skin of left lower limb, including hip: Secondary | ICD-10-CM | POA: Diagnosis not present

## 2021-10-25 ENCOUNTER — Other Ambulatory Visit: Payer: Self-pay | Admitting: Family Medicine

## 2021-10-30 ENCOUNTER — Encounter: Payer: Self-pay | Admitting: Family Medicine

## 2021-10-30 ENCOUNTER — Ambulatory Visit (INDEPENDENT_AMBULATORY_CARE_PROVIDER_SITE_OTHER): Payer: Medicare Other | Admitting: Family Medicine

## 2021-10-30 VITALS — BP 124/80 | HR 75 | Temp 98.2°F | Ht 66.0 in | Wt 175.0 lb

## 2021-10-30 DIAGNOSIS — E119 Type 2 diabetes mellitus without complications: Secondary | ICD-10-CM | POA: Diagnosis not present

## 2021-10-30 DIAGNOSIS — G4733 Obstructive sleep apnea (adult) (pediatric): Secondary | ICD-10-CM | POA: Diagnosis not present

## 2021-10-30 DIAGNOSIS — Z0000 Encounter for general adult medical examination without abnormal findings: Secondary | ICD-10-CM

## 2021-10-30 DIAGNOSIS — Z7189 Other specified counseling: Secondary | ICD-10-CM

## 2021-10-30 LAB — CBC WITH DIFFERENTIAL/PLATELET
Basophils Absolute: 0 10*3/uL (ref 0.0–0.1)
Basophils Relative: 1 % (ref 0.0–3.0)
Eosinophils Absolute: 0.2 10*3/uL (ref 0.0–0.7)
Eosinophils Relative: 4.1 % (ref 0.0–5.0)
HCT: 44.3 % (ref 39.0–52.0)
Hemoglobin: 15 g/dL (ref 13.0–17.0)
Lymphocytes Relative: 32.2 % (ref 12.0–46.0)
Lymphs Abs: 1.6 10*3/uL (ref 0.7–4.0)
MCHC: 33.9 g/dL (ref 30.0–36.0)
MCV: 91.5 fl (ref 78.0–100.0)
Monocytes Absolute: 0.5 10*3/uL (ref 0.1–1.0)
Monocytes Relative: 9.7 % (ref 3.0–12.0)
Neutro Abs: 2.6 10*3/uL (ref 1.4–7.7)
Neutrophils Relative %: 53 % (ref 43.0–77.0)
Platelets: 144 10*3/uL — ABNORMAL LOW (ref 150.0–400.0)
RBC: 4.84 Mil/uL (ref 4.22–5.81)
RDW: 13.1 % (ref 11.5–15.5)
WBC: 4.9 10*3/uL (ref 4.0–10.5)

## 2021-10-30 LAB — COMPREHENSIVE METABOLIC PANEL
ALT: 19 U/L (ref 0–53)
AST: 16 U/L (ref 0–37)
Albumin: 4.9 g/dL (ref 3.5–5.2)
Alkaline Phosphatase: 61 U/L (ref 39–117)
BUN: 10 mg/dL (ref 6–23)
CO2: 30 mEq/L (ref 19–32)
Calcium: 9.9 mg/dL (ref 8.4–10.5)
Chloride: 100 mEq/L (ref 96–112)
Creatinine, Ser: 0.59 mg/dL (ref 0.40–1.50)
GFR: 92.36 mL/min (ref >60.00)
Glucose, Bld: 216 mg/dL — ABNORMAL HIGH (ref 70–99)
Potassium: 5.1 mEq/L (ref 3.5–5.1)
Sodium: 139 mEq/L (ref 135–145)
Total Bilirubin: 1 mg/dL (ref 0.2–1.2)
Total Protein: 7.5 g/dL (ref 6.0–8.3)

## 2021-10-30 LAB — LIPID PANEL
Cholesterol: 164 mg/dL (ref 0–200)
HDL: 52.6 mg/dL (ref >39.00)
LDL Cholesterol: 99 mg/dL (ref 0–99)
NonHDL: 110.96
Total CHOL/HDL Ratio: 3
Triglycerides: 61 mg/dL (ref 0.0–149.0)
VLDL: 12.2 mg/dL (ref 0.0–40.0)

## 2021-10-30 LAB — HEMOGLOBIN A1C: Hgb A1c MFr Bld: 8.3 % — ABNORMAL HIGH (ref 4.6–6.5)

## 2021-10-30 LAB — MICROALBUMIN / CREATININE URINE RATIO
Creatinine,U: 41.6 mg/dL
Microalb Creat Ratio: 1.7 mg/g (ref 0.0–30.0)
Microalb, Ur: <0.7 mg/dL (ref 0.0–1.9)

## 2021-10-30 MED ORDER — PRAVASTATIN SODIUM 20 MG PO TABS: 20.0000 mg | ORAL_TABLET | Freq: Every day | ORAL | 3 refills | Status: DC

## 2021-10-30 NOTE — Progress Notes (Unsigned)
Diabetes:  No meds except statin.   Hypoglycemic episodes: no  Hyperglycemic episodes: no  Feet problems: no Blood Sugars averaging: not checked.   eye exam within last year: yes Labs pending.  D/w pt about diet and exercise.  He is going to walk more when in [place], d/w pt.  No aches on statin.    Compliant with CPAP, had pulmonary f/u.    His nephew recently passed.  Condolences offered.    Lesion on L leg removed by Dr. [facility].  He has routine f/u.  Per patient he is going to have f/u.    Flu discussed with patient, declined today, encouraged.   Shingles vaccine discussed with patient.  Status post Zostavax. PNA up-to-date Tetanus may be cheaper at pharmacy, discussed with patient. COVID-vaccine discussed with patient. Colonoscopy 2017 Prostate cancer screening and PSA options (with potential risks and benefits of testing vs not testing) were discussed along with recent recs/guidelines.  He declined testing PSA at this point. Advance directive-wife designated patient were incapacitated. If needed, would then have both daughters designated.    He is going back to [place] soon.    PMH and SH reviewed  Meds, vitals, and allergies reviewed.   ROS: Per HPI unless specifically indicated in ROS section   GEN: nad, alert and oriented HEENT: ncat NECK: supple w/o LA CV: rrr. PULM: ctab, no inc wob ABD: soft, +bs EXT: no edema SKIN: no acute rash  Diabetic foot exam: Normal inspection No skin breakdown No calluses  Normal DP pulses Normal sensation to light touch and monofilament Nails normal  30 minutes were devoted to patient care in this encounter (this includes time spent reviewing the patient's file/history, interviewing and examining the patient, counseling/reviewing plan with patient).

## 2021-10-30 NOTE — Patient Instructions (Signed)
Go to the lab on the way out.   If you have mychart we'll likely use that to update you.    Take care.  Glad to see you. I would get a flu shot each fall.

## 2021-10-31 DIAGNOSIS — H353211 Exudative age-related macular degeneration, right eye, with active choroidal neovascularization: Secondary | ICD-10-CM | POA: Diagnosis not present

## 2021-11-01 NOTE — Assessment & Plan Note (Signed)
Advance directive-wife designated patient were incapacitated. If needed, would then have both daughters designated.

## 2021-11-01 NOTE — Assessment & Plan Note (Signed)
Compliant with CPAP, had pulmonary f/u.

## 2021-11-01 NOTE — Assessment & Plan Note (Signed)
Flu discussed with patient, declined today, encouraged.   Shingles vaccine discussed with patient.  Status post Zostavax. PNA up-to-date Tetanus may be cheaper at pharmacy, discussed with patient. COVID-vaccine discussed with patient. Colonoscopy 2017 Prostate cancer screening and PSA options (with potential risks and benefits of testing vs not testing) were discussed along with recent recs/guidelines.  He declined testing PSA at this point. Advance directive-wife designated patient were incapacitated. If needed, would then have both daughters designated.

## 2021-11-01 NOTE — Assessment & Plan Note (Signed)
Labs pending.  D/w pt about diet and exercise.  He is going to walk more when in [place], d/w pt.  No aches on statin.  See notes on labs.  Recheck periodically.

## 2021-11-02 ENCOUNTER — Telehealth: Payer: Self-pay | Admitting: Family Medicine

## 2021-11-02 NOTE — Telephone Encounter (Signed)
Let me know when results are ready and I will call patient back.

## 2021-11-02 NOTE — Telephone Encounter (Signed)
See result note.  Thanks!

## 2021-11-02 NOTE — Telephone Encounter (Signed)
Pt called asking if his results was available from his labs on 10/30/21? Pt requested a call back once results are available @ [phone]

## 2021-11-06 DIAGNOSIS — G4733 Obstructive sleep apnea (adult) (pediatric): Secondary | ICD-10-CM | POA: Diagnosis not present

## 2021-12-04 DIAGNOSIS — Z08 Encounter for follow-up examination after completed treatment for malignant neoplasm: Secondary | ICD-10-CM | POA: Diagnosis not present

## 2021-12-04 DIAGNOSIS — Z872 Personal history of diseases of the skin and subcutaneous tissue: Secondary | ICD-10-CM | POA: Diagnosis not present

## 2021-12-04 DIAGNOSIS — L700 Acne vulgaris: Secondary | ICD-10-CM | POA: Diagnosis not present

## 2021-12-04 DIAGNOSIS — Z85828 Personal history of other malignant neoplasm of skin: Secondary | ICD-10-CM | POA: Diagnosis not present

## 2022-01-01 DIAGNOSIS — H353211 Exudative age-related macular degeneration, right eye, with active choroidal neovascularization: Secondary | ICD-10-CM | POA: Diagnosis not present

## 2022-02-19 ENCOUNTER — Ambulatory Visit: Payer: Medicare Other | Admitting: Family Medicine

## 2022-02-26 ENCOUNTER — Encounter: Payer: Self-pay | Admitting: Family Medicine

## 2022-02-26 ENCOUNTER — Ambulatory Visit (INDEPENDENT_AMBULATORY_CARE_PROVIDER_SITE_OTHER): Payer: Medicare Other | Admitting: Family Medicine

## 2022-02-26 VITALS — BP 120/62 | HR 94 | Temp 98.0°F | Ht 66.0 in | Wt 159.0 lb

## 2022-02-26 DIAGNOSIS — E119 Type 2 diabetes mellitus without complications: Secondary | ICD-10-CM

## 2022-02-26 DIAGNOSIS — Z8639 Personal history of other endocrine, nutritional and metabolic disease: Secondary | ICD-10-CM

## 2022-02-26 LAB — POCT GLYCOSYLATED HEMOGLOBIN (HGB A1C): Hemoglobin A1C: 6.3 % — AB (ref 4.0–5.6)

## 2022-02-26 NOTE — Progress Notes (Unsigned)
History of DM2.   Hypoglycemic episodes: no Hyperglycemic episodes: no Feet problems: no Blood Sugars averaging: not checked.  A1c clearly better.  D/w pt at [place].   Intentional weight loss.  Dw pt about diet and exercise- dramatic changes in diet.    His wife had a stroke, d/w pt.  She is home now, she is going through PT at home.  His niece is helping relieve him some at home.  His daughter is helping out.    Meds, vitals, and allergies reviewed.   ROS: Per HPI unless specifically indicated in ROS section   GEN: nad, alert and oriented HEENT: ncat NECK: supple w/o LA CV: rrr. PULM: ctab, no inc wob ABD: soft, +bs EXT: no edema SKIN: no acute rash  Diabetic foot exam: Normal inspection No skin breakdown No calluses  Normal DP pulses Normal sensation to light touch and monofilament Nails normal

## 2022-02-26 NOTE — Patient Instructions (Addendum)
Thanks for your effort.   Take care.  Glad to see you. Your sugar isn't totally normal but it is clearly better.  I would recheck your labs at a yearly visit this fall.  Update me in the meantime if needed.

## 2022-02-28 NOTE — Assessment & Plan Note (Signed)
A1c clearly better.  D/w pt at OV.   Intentional weight loss.  Dw pt about diet and exercise- dramatic changes in diet.   Continue as is. I would recheck labs at a yearly visit this fall.  Update me in the meantime if needed.

## 2022-04-02 ENCOUNTER — Ambulatory Visit: Payer: Medicare Other | Admitting: Family Medicine

## 2022-04-09 DIAGNOSIS — H353211 Exudative age-related macular degeneration, right eye, with active choroidal neovascularization: Secondary | ICD-10-CM | POA: Diagnosis not present

## 2022-04-24 DIAGNOSIS — L814 Other melanin hyperpigmentation: Secondary | ICD-10-CM | POA: Diagnosis not present

## 2022-04-24 DIAGNOSIS — L821 Other seborrheic keratosis: Secondary | ICD-10-CM | POA: Diagnosis not present

## 2022-04-24 DIAGNOSIS — D225 Melanocytic nevi of trunk: Secondary | ICD-10-CM | POA: Diagnosis not present

## 2022-04-24 DIAGNOSIS — L570 Actinic keratosis: Secondary | ICD-10-CM | POA: Diagnosis not present

## 2022-06-04 ENCOUNTER — Telehealth: Payer: Self-pay | Admitting: Family Medicine

## 2022-06-04 NOTE — Telephone Encounter (Signed)
I think we need to check him in person.  Please schedule when possible.  Thanks.

## 2022-06-04 NOTE — Telephone Encounter (Signed)
Scheduled patient for 5/20

## 2022-06-04 NOTE — Telephone Encounter (Signed)
Patient contacted the office regarding an issue and requesting advice. Patient states he had fallen out of bed about 2 weeks ago and hit his shoulder on a nightstand. States he has no pain but there is a knot on his L shoulder, did not notice this until Saturday 5/11. Wants to know what he should do next, says he is not in pain but the knot is concerning. Would like a call back, [phone]. Please advise, thank you.

## 2022-06-11 ENCOUNTER — Ambulatory Visit (INDEPENDENT_AMBULATORY_CARE_PROVIDER_SITE_OTHER)
Admission: RE | Admit: 2022-06-11 | Discharge: 2022-06-11 | Disposition: A | Discharge status: Home / Self Care | Payer: Medicare Other | Source: Ambulatory Visit | Attending: Family Medicine | Admitting: Family Medicine

## 2022-06-11 ENCOUNTER — Encounter: Payer: Self-pay | Admitting: Family Medicine

## 2022-06-11 ENCOUNTER — Ambulatory Visit (INDEPENDENT_AMBULATORY_CARE_PROVIDER_SITE_OTHER): Payer: Medicare Other | Admitting: Family Medicine

## 2022-06-11 VITALS — BP 132/74 | HR 93 | Temp 98.4°F | Ht 66.0 in | Wt 157.0 lb

## 2022-06-11 DIAGNOSIS — Q740 Other congenital malformations of upper limb(s), including shoulder girdle: Secondary | ICD-10-CM | POA: Diagnosis not present

## 2022-06-11 DIAGNOSIS — M19012 Primary osteoarthritis, left shoulder: Secondary | ICD-10-CM | POA: Diagnosis not present

## 2022-06-11 NOTE — Progress Notes (Unsigned)
Shoulder eval.  Fell 2 weeks ago, tripped on CPAP hose/cord and hit nightstand.  May have hit L shoulder at the time.  Went to bathroom and didn't have LOC.  Then went back to bed.  No other injury. No pain.  Normal ROM.  He is R handed.  Family noted a knot on the shoulder.  Wanted that evaluated.  Meds, vitals, and allergies reviewed.   ROS: Per HPI unless specifically indicated in ROS section   Nad Ncat Neck supple, no LA Normal ROM L shoulder with internal/external rotation and abduction but prominent AC joint, lateral clavicle.  Not ttp locally.  No bruising.  No erythema.  No arm drop.

## 2022-06-11 NOTE — Patient Instructions (Signed)
Go to the lab on the way out.   If you have mychart we'll likely use that to update you.    Take care.  Glad to see you. 

## 2022-06-13 ENCOUNTER — Telehealth: Payer: Self-pay | Admitting: Family Medicine

## 2022-06-13 DIAGNOSIS — Q740 Other congenital malformations of upper limb(s), including shoulder girdle: Secondary | ICD-10-CM | POA: Insufficient documentation

## 2022-06-13 NOTE — Telephone Encounter (Signed)
I have not received any results yet to go over. Let me know when they are resulted and I will call patient.

## 2022-06-13 NOTE — Telephone Encounter (Signed)
I will sign off on the result note when possible.  Thanks.

## 2022-06-13 NOTE — Telephone Encounter (Signed)
Patient called in regarding results from shoulder xray from  06/11/2022. Would like a phone call to discuss the findings.

## 2022-06-13 NOTE — Assessment & Plan Note (Signed)
He has normal shoulder function on exam and is not having pain.  He does have a prominent lateral clavicle and I think it makes sense to x-ray this just to make sure there is not an obvious abnormality.  See notes on imaging.

## 2022-06-14 NOTE — Telephone Encounter (Signed)
Patient notified of results.

## 2022-06-14 NOTE — Telephone Encounter (Signed)
Patient contacted the office again regarding this request, advised patient results had not been posted yet and to give a little more time.

## 2022-09-04 ENCOUNTER — Telehealth: Payer: Self-pay | Admitting: Family Medicine

## 2022-09-04 NOTE — Telephone Encounter (Signed)
Called patient reviewed all information and repeated back to me. Will call if any questions. He is not having any sob or any new fevers.

## 2022-09-04 NOTE — Telephone Encounter (Signed)
Pt called in requesting a call back stated he has a cough and congestion wants to know what over the counter medication he should get  refuse to schedule appointment . Please advise #  [phone]

## 2022-09-04 NOTE — Telephone Encounter (Signed)
If fever, SOB, progressive sx, then rec in person eval.    Could try otc claritin 10mg  and prn plain robitussin.  Rest and fluids.

## 2022-09-13 ENCOUNTER — Ambulatory Visit (INDEPENDENT_AMBULATORY_CARE_PROVIDER_SITE_OTHER): Payer: Medicare Other

## 2022-09-13 VITALS — Ht 66.0 in | Wt 155.0 lb

## 2022-09-13 DIAGNOSIS — Z0000 Encounter for general adult medical examination without abnormal findings: Secondary | ICD-10-CM | POA: Diagnosis not present

## 2022-09-13 NOTE — Progress Notes (Signed)
Subjective:   Roger [name] is a 82 y.o. male who presents for Medicare Annual/Subsequent preventive examination.  Visit Complete: Virtual  I connected with  Roger [name] on 09/13/22 by a audio enabled telemedicine application and verified that I am speaking with the correct person using two identifiers.  Patient Location: Home  Provider Location: Office/Clinic  I discussed the limitations of evaluation and management by telemedicine. The patient expressed understanding and agreed to proceed.  Vital Signs: Because this visit was a virtual/telehealth visit, some criteria may be missing or patient reported. Any vitals not documented were not able to be obtained and vitals that have been documented are patient reported.    Review of Systems      Cardiac Risk Factors include: advanced age (>54men, >83 women);male gender;dyslipidemia     Objective:    Today's Vitals   09/13/22 0959  Weight: 155 lb (70.3 kg)  Height: 5\' 6"  (1.676 m)   Body mass index is 25.02 kg/m.     09/13/2022   10:06 AM 09/11/2021   11:01 AM 05/16/2021    8:27 PM 07/30/2019    9:51 AM 06/02/2015    1:14 PM  Advanced Directives  Does Patient Have a Medical Advance Directive? No No No No Yes  Type of Agricultural consultant;Living will  Does patient want to make changes to medical advance directive?     No - Patient declined  Copy of Healthcare Power of Attorney in Chart?     No - copy requested  Would patient like information on creating a medical advance directive? No - Patient declined No - Patient declined No - Patient declined No - Patient declined     Current Medications (verified) Outpatient Encounter Medications as of 09/13/2022  Medication Sig   Multiple Vitamins-Minerals (PRESERVISION AREDS 2+MULTI VIT PO) Take by mouth 2 (two) times daily.   pravastatin (PRAVACHOL) 20 MG tablet Take 1 tablet (20 mg total) by mouth daily.   No facility-administered  encounter medications on file as of 09/13/2022.    Allergies (verified) Patient has no known allergies.   History: Past Medical History:  Diagnosis Date   Acne    Dr. Terri Piedra   Diabetes mellitus    h/o Type 2/no meds   Hyperlipidemia    Skin cancer    on back   Past Surgical History:  Procedure Laterality Date   APPENDECTOMY  7th grade   COLONOSCOPY     Melanoma Excision of back  1996   Dr. 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[employer])  Tobacco Use   Smoking status: Never   Smokeless tobacco: Never  Substance and Sexual Activity   Alcohol use: Yes    Alcohol/week: 0.0 standard drinks of alcohol    Comment: rare   Drug use: No   Sexual activity:  Not on file  Other Topics Concern   Not on file  Social History Narrative   Regular exercise - walking frequently   Married 1962   Twin daughters, 3 grandkids and 9 great grandkids   Splits time between [place] and [address] and [place]   Working Retail banker   Social Determinants of Health   Financial Resource Strain: Low Risk  (09/13/2022)   Overall Financial Resource Strain (CARDIA)    Difficulty of Paying Living Expenses: Not hard at all  Food Insecurity: No Food Insecurity (09/13/2022)   Hunger Vital Sign    Worried About Running Out of Food in the Last Year: Never true    Ran Out of Food in the Last Year: Never true  Transportation Needs: No Transportation Needs (09/13/2022)   PRAPARE - Administrator, Civil Service (Medical): No    Lack of Transportation (Non-Medical): No   Physical Activity: Insufficiently Active (09/13/2022)   Exercise Vital Sign    Days of Exercise per Week: 3 days    Minutes of Exercise per Session: 30 min  Stress: No Stress Concern Present (09/13/2022)   [employer] of Occupational Health - Occupational Stress Questionnaire    Feeling of Stress : Not at all  Social Connections: Moderately Integrated (09/13/2022)   Social Connection and Isolation Panel [NHANES]    Frequency of Communication with Friends and Family: More than three times a week    Frequency of Social Gatherings with Friends and Family: More than three times a week    Attends Religious Services: More than 4 times per year    Active Member of [employer] or Organizations: No    Attends Engineer, structural: Never    Marital Status: Married    Tobacco Counseling Counseling given: Not Answered   Clinical Intake:  Pre-visit preparation completed: Yes  Pain : No/denies pain     BMI - recorded: 25.02 Nutritional Status: BMI 25 -29 Overweight Nutritional Risks: None Diabetes: No  How often do you need to have someone help you when you read instructions, pamphlets, or other written materials from your doctor or pharmacy?: 1 - Never  Interpreter Needed?: No  Information entered by :: C.Jakelin Taussig LPN   Activities of Daily Living    09/13/2022   10:07 AM  In your present state of health, do you have any difficulty performing the following activities:  Hearing? 1  Comment aids  Vision? 0  Difficulty concentrating or making decisions? 0  Walking or climbing stairs? 0  Dressing or bathing? 0  Doing errands, shopping? 0  Preparing Food and eating ? N  Using the Toilet? N  In the past six months, have you accidently leaked urine? N  Do you have problems with loss of bowel control? N  Managing your Medications? N  Managing your Finances? N  Housekeeping or managing your Housekeeping? N    Patient Care Team: Joaquim Nam, MD as PCP -  General Genene Churn, DC as Referring Physician (Chiropractic Medicine) [place], Heather, OD (Optometry) Sherrie George, MD as Consulting Physician (Ophthalmology) [facility]  Indicate any recent Medical Services you may have received from other than [facility] providers in the past year (date may be approximate).     Assessment:   This is a routine wellness examination for Roger [name].  Hearing/Vision screen Hearing Screening - Comments:: Bilateral hearing aids Vision Screening - Comments:: No vision issues - UTD on exams with Dr.Patel  Dietary issues and exercise activities discussed:  Goals Addressed             This Visit's Progress    Patient Stated       Maintain current health status.       Depression Screen    09/13/2022   10:02 AM 06/11/2022    8:35 AM 02/26/2022    9:56 AM 09/11/2021   10:59 AM 12/26/2020    2:13 PM 12/07/2019    8:17 AM 07/30/2019    9:53 AM  PHQ 2/9 Scores  PHQ - 2 Score 0 0 0 0 0 0 0  PHQ- 9 Score  0 0 0   0    Fall Risk    09/13/2022   10:05 AM 06/11/2022    8:34 AM 02/26/2022    9:56 AM 09/11/2021   11:02 AM 09/05/2020    8:29 AM  Fall Risk   Falls in the past year? 0 0 0 0 0  Number falls in past yr: 0 0 0 0 0  Injury with Fall? 0 0 0 0 0  Risk for fall due to : No Fall Risks No Fall Risks No Fall Risks No Fall Risks No Fall Risks  Follow up Falls prevention discussed;Falls evaluation completed Falls evaluation completed Falls evaluation completed Falls evaluation completed Falls evaluation completed    MEDICARE RISK AT HOME: Medicare Risk at Home Any stairs in or around the home?: Yes If so, are there any without handrails?: No Home free of loose throw rugs in walkways, pet beds, electrical cords, etc?: Yes Adequate lighting in your home to reduce risk of falls?: Yes Life alert?: No Use of a cane, walker or w/c?: No Grab bars in the bathroom?: Yes Shower chair or bench in shower?: Yes Elevated toilet seat or a  handicapped toilet?: Yes  TIMED UP AND GO:  Was the test performed?  No    Cognitive Function:    07/30/2019    9:56 AM  MMSE - Mini Mental State Exam  Not completed: Refused        09/13/2022   10:08 AM 09/11/2021   11:02 AM  6CIT Screen  What Year? 0 points 0 points  What month? 0 points 0 points  What time? 0 points 0 points  Count back from 20 0 points 0 points  Months in reverse 0 points 0 points  Repeat phrase 0 points 0 points  Total Score 0 points 0 points    Immunizations Immunization History  Administered Date(s) Administered   Fluad Quad(high Dose 65+) 01/05/2019, 12/07/2019, 12/26/2020   Influenza Whole 10/04/2009   Influenza,inj,Quad PF,6+ Mos 10/06/2013, 12/13/2014, 12/19/2015, 11/26/2017   Influenza-Unspecified 10/07/2015   PFIZER(Purple Top)SARS-COV-2 Vaccination 04/13/2019, 05/06/2019   Pneumococcal Conjugate-13 12/13/2014   Pneumococcal Polysaccharide-23 09/15/2012   Zoster, Live 09/04/2011    TDAP status: Due, Education has been provided regarding the importance of this vaccine. Advised may receive this vaccine at local pharmacy or Health Dept. Aware to provide a copy of the vaccination record if obtained from local pharmacy or Health Dept. Verbalized acceptance and understanding.  Flu Vaccine status: Declined, Education has been provided regarding the importance of this vaccine but patient still declined. Advised may receive this vaccine at local pharmacy or Health Dept. Aware to provide a copy of the vaccination record if obtained from local pharmacy or Health Dept. Verbalized acceptance and understanding.  Pneumococcal vaccine status: Up to date  Covid-19 vaccine status: Declined, Education has been provided regarding the importance of this vaccine but patient still  declined. Advised may receive this vaccine at local pharmacy or Health Dept.or vaccine clinic. Aware to provide a copy of the vaccination record if obtained from local pharmacy or Health  Dept. Verbalized acceptance and understanding.  Qualifies for Shingles Vaccine? Yes   Zostavax completed Yes   Shingrix Completed?: No.    Education has been provided regarding the importance of this vaccine. Patient has been advised to call insurance company to determine out of pocket expense if they have not yet received this vaccine. Advised may also receive vaccine at local pharmacy or Health Dept. Verbalized acceptance and understanding.  Screening Tests Health Maintenance  Topic Date Due   DTaP/Tdap/Td (1 - Tdap) Never done   Zoster Vaccines- Shingrix (1 of 2) 06/08/1992   OPHTHALMOLOGY EXAM  08/10/2021   INFLUENZA VACCINE  08/23/2022   HEMOGLOBIN A1C  08/27/2022   Diabetic kidney evaluation - eGFR measurement  10/31/2022   Diabetic kidney evaluation - Urine ACR  10/31/2022   FOOT EXAM  02/27/2023   Medicare Annual Wellness (AWV)  09/13/2023   Pneumonia Vaccine 4+ Years old  Completed   HPV VACCINES  Aged Out   COVID-19 Vaccine  Discontinued    Health Maintenance  Health Maintenance Due  Topic Date Due   DTaP/Tdap/Td (1 - Tdap) Never done   Zoster Vaccines- Shingrix (1 of 2) 06/08/1992   OPHTHALMOLOGY EXAM  08/10/2021   INFLUENZA VACCINE  08/23/2022   HEMOGLOBIN A1C  08/27/2022    Colorectal cancer screening: No longer required.   Lung Cancer Screening: (Low Dose CT Chest recommended if Age 32-80 years, 20 pack-year currently smoking OR have quit w/in 15years.) does not qualify.   Lung Cancer Screening Referral:    Additional Screening:  Hepatitis C Screening: does not qualify;   Vision Screening: Recommended annual ophthalmology exams for early detection of glaucoma and other disorders of the eye. Is the patient up to date with their annual eye exam?  Yes  Who is the provider or what is the name of the office in which the patient attends annual eye exams? Dr.Patel at [facility] If pt is not established with a provider, would they like to be referred to a  provider to establish care? Yes .   Dental Screening: Recommended annual dental exams for proper oral hygiene    Community Resource Referral / Chronic Care Management: CRR required this visit?  No   CCM required this visit?  No     Plan:     I have personally reviewed and noted the following in the patient's chart:   Medical and social history Use of alcohol, tobacco or illicit drugs  Current medications and supplements including opioid prescriptions. Patient is not currently taking opioid prescriptions. Functional ability and status Nutritional status Physical activity Advanced directives List of other physicians Hospitalizations, surgeries, and ER visits in previous 12 months Vitals Screenings to include cognitive, depression, and falls Referrals and appointments  In addition, I have reviewed and discussed with patient certain preventive protocols, quality metrics, and best practice recommendations. A written personalized care plan for preventive services as well as general preventive health recommendations were provided to patient.     Maryan Puls, LPN   1/61/0960   After Visit Summary: (Declined) Due to this being a telephonic visit, with patients personalized plan was offered to patient but patient Declined AVS at this time   Nurse Notes: Last eye exam requested from [facility].

## 2022-09-13 NOTE — Patient Instructions (Signed)
Roger [name] , Thank you for taking time to come for your Medicare Wellness Visit. I appreciate your ongoing commitment to your health goals. Please review the following plan we discussed and let me know if I can assist you in the future.   Referrals/Orders/Follow-Ups/Clinician Recommendations: Aim for 30 minutes of exercise or brisk walking, 6-8 glasses of water, and 5 servings of fruits and vegetables each day.   This is a list of the screening recommended for you and due dates:  Health Maintenance  Topic Date Due   DTaP/Tdap/Td vaccine (1 - Tdap) Never done   Zoster (Shingles) Vaccine (1 of 2) 06/08/1992   Eye exam for diabetics  08/10/2021   Medicare Annual Wellness Visit  09/12/2022   Flu Shot  08/23/2022   Hemoglobin A1C  08/27/2022   Yearly kidney function blood test for diabetes  10/31/2022   Yearly kidney health urinalysis for diabetes  10/31/2022   Complete foot exam   02/27/2023   Pneumonia Vaccine  Completed   HPV Vaccine  Aged Out   COVID-19 Vaccine  Discontinued    Advanced directives: (Declined) Advance directive discussed with you today. Even though you declined this today, please call our office should you change your mind, and we can give you the proper paperwork for you to fill out.  Next Medicare Annual Wellness Visit scheduled for next year: Yes

## 2022-11-05 ENCOUNTER — Encounter: Payer: Self-pay | Admitting: Family Medicine

## 2022-11-05 ENCOUNTER — Ambulatory Visit: Payer: Medicare Other | Admitting: Family Medicine

## 2022-11-05 VITALS — BP 132/68 | HR 74 | Temp 99.1°F | Ht 65.0 in | Wt 157.2 lb

## 2022-11-05 DIAGNOSIS — Z7189 Other specified counseling: Secondary | ICD-10-CM | POA: Diagnosis not present

## 2022-11-05 DIAGNOSIS — E785 Hyperlipidemia, unspecified: Secondary | ICD-10-CM

## 2022-11-05 DIAGNOSIS — Z8639 Personal history of other endocrine, nutritional and metabolic disease: Secondary | ICD-10-CM

## 2022-11-05 DIAGNOSIS — Z0000 Encounter for general adult medical examination without abnormal findings: Secondary | ICD-10-CM

## 2022-11-05 LAB — LIPID PANEL
Cholesterol: 155 mg/dL (ref 0–200)
HDL: 52.2 mg/dL (ref >39.00)
LDL Cholesterol: 90 mg/dL (ref 0–99)
NonHDL: 102.99
Total CHOL/HDL Ratio: 3
Triglycerides: 65 mg/dL (ref 0.0–149.0)
VLDL: 13 mg/dL (ref 0.0–40.0)

## 2022-11-05 LAB — CBC WITH DIFFERENTIAL/PLATELET
Basophils Absolute: 0.1 10*3/uL (ref 0.0–0.1)
Basophils Relative: 1.3 % (ref 0.0–3.0)
Eosinophils Absolute: 0.2 10*3/uL (ref 0.0–0.7)
Eosinophils Relative: 3.2 % (ref 0.0–5.0)
HCT: 44.6 % (ref 39.0–52.0)
Hemoglobin: 14.9 g/dL (ref 13.0–17.0)
Lymphocytes Relative: 27.2 % (ref 12.0–46.0)
Lymphs Abs: 1.4 10*3/uL (ref 0.7–4.0)
MCHC: 33.5 g/dL (ref 30.0–36.0)
MCV: 90.9 fL (ref 78.0–100.0)
Monocytes Absolute: 0.5 10*3/uL (ref 0.1–1.0)
Monocytes Relative: 10.1 % (ref 3.0–12.0)
Neutro Abs: 2.9 10*3/uL (ref 1.4–7.7)
Neutrophils Relative %: 58.2 % (ref 43.0–77.0)
Platelets: 152 10*3/uL (ref 150.0–400.0)
RBC: 4.91 Mil/uL (ref 4.22–5.81)
RDW: 13.4 % (ref 11.5–15.5)
WBC: 5 10*3/uL (ref 4.0–10.5)

## 2022-11-05 LAB — COMPREHENSIVE METABOLIC PANEL
ALT: 17 U/L (ref 0–53)
AST: 15 U/L (ref 0–37)
Albumin: 4.7 g/dL (ref 3.5–5.2)
Alkaline Phosphatase: 51 U/L (ref 39–117)
BUN: 12 mg/dL (ref 6–23)
CO2: 30 meq/L (ref 19–32)
Calcium: 9.7 mg/dL (ref 8.4–10.5)
Chloride: 102 meq/L (ref 96–112)
Creatinine, Ser: 0.54 mg/dL (ref 0.40–1.50)
GFR: 94.19 mL/min (ref >60.00)
Glucose, Bld: 164 mg/dL — ABNORMAL HIGH (ref 70–99)
Potassium: 4.5 meq/L (ref 3.5–5.1)
Sodium: 139 meq/L (ref 135–145)
Total Bilirubin: 1.1 mg/dL (ref 0.2–1.2)
Total Protein: 7.2 g/dL (ref 6.0–8.3)

## 2022-11-05 LAB — HEMOGLOBIN A1C: Hgb A1c MFr Bld: 6.5 % (ref 4.6–6.5)

## 2022-11-05 MED ORDER — PRAVASTATIN SODIUM 20 MG PO TABS: 20.0000 mg | ORAL_TABLET | Freq: Every day | ORAL | 3 refills | Status: DC

## 2022-11-05 NOTE — Patient Instructions (Signed)
Thanks for your effort.   Go to the lab on the way out.   If you have mychart we'll likely use that to update you.    Take care.  Glad to see you. I would get a flu shot each fall.

## 2022-11-05 NOTE — Assessment & Plan Note (Signed)
Continue work on diet and exercise.  See notes on labs.

## 2022-11-05 NOTE — Progress Notes (Signed)
H/o DM2.  No meds currently.   Working on diet and exercise.  H/o intentional weight loss.  Prev labs d/w pt.  A1c pending.    He had dermatology and eye clinic f/u.    Elevated Cholesterol: Using medications without problems: yes Muscle aches: no Diet compliance: yes Exercise: yes Labs pending.   Flu discussed with patient, declined today, encouraged.   Shingles vaccine discussed with patient.  Status post Zostavax. PNA up-to-date Tetanus may be cheaper at pharmacy, discussed with patient. COVID-vaccine prev d/w pt.   Colonoscopy 2017, not due after age 8.   Prostate cancer screening and PSA options (with potential risks and benefits of testing vs not testing) were discussed along with recent recs/guidelines.  He declined testing PSA at this point. Advance directive-wife designated patient were incapacitated. If needed, would then have both daughters designated.    Meds, vitals, and allergies reviewed.   ROS: Per HPI unless specifically indicated in ROS section   GEN: nad, alert and oriented HEENT: mucous membranes moist NECK: supple w/o LA CV: rrr. PULM: ctab, no inc wob ABD: soft, +bs EXT: no edema SKIN: no acute rash  Diabetic foot exam: Normal inspection No skin breakdown No calluses  Normal DP pulses Normal sensation to light touch and monofilament Nails normal

## 2022-11-05 NOTE — Assessment & Plan Note (Signed)
Continue pravastatin, see notes on labs, continue work on diet and exercise.

## 2022-11-05 NOTE — Assessment & Plan Note (Signed)
Advance directive-wife designated patient were incapacitated. If needed, would then have both daughters designated.

## 2022-11-05 NOTE — Assessment & Plan Note (Addendum)
Flu discussed with patient, declined today, encouraged.   Shingles vaccine discussed with patient.  Status post Zostavax. PNA up-to-date Tetanus may be cheaper at pharmacy, discussed with patient. COVID-vaccine prev d/w pt.   Colonoscopy 2017, not due after age 82.   Prostate cancer screening and PSA options (with potential risks and benefits of testing vs not testing) were discussed along with recent recs/guidelines.  He declined testing PSA at this point. Advance directive-wife designated patient were incapacitated. If needed, would then have both daughters designated.

## 2023-03-02 ENCOUNTER — Other Ambulatory Visit: Payer: Self-pay | Admitting: Family Medicine

## 2023-04-04 ENCOUNTER — Other Ambulatory Visit: Payer: Self-pay | Admitting: Family Medicine

## 2023-04-04 MED ORDER — PRAVASTATIN SODIUM 20 MG PO TABS: 20.0000 mg | ORAL_TABLET | Freq: Every day | ORAL | 0 refills | Status: DC

## 2023-04-04 NOTE — Telephone Encounter (Signed)
 Patient called, recording call could not be completed as dialed. I listened to the recorded call and patient stated he needs pravastatin and wants it sent to [facility] [address] [place], [place]. [place] so he needs this sent in today or tomorrow, only 2 pills left.

## 2023-04-04 NOTE — Telephone Encounter (Signed)
 Copied from CRM [id number]. Topic: Clinical - Prescription Issue >> Apr 04, 2023  8:33 AM Theodis Sato wrote: Reason for CRM: Patient is requesting Dr. Para March to send his [address] - [phone] [facility] : [facility] [address] [place], [place] - [phone] [facility] [address] [place] [address] [place] [address] Phone: [phone] Fax: [phone]   *Patient has valid refills but needs the medication sent to a different pharmacy and will run out of this medication is two days.*

## 2023-04-26 ENCOUNTER — Other Ambulatory Visit: Payer: Self-pay | Admitting: Family Medicine

## 2023-05-13 ENCOUNTER — Ambulatory Visit: Admitting: Family Medicine

## 2023-05-20 ENCOUNTER — Ambulatory Visit: Payer: Medicare Other | Admitting: Family Medicine

## 2023-05-27 ENCOUNTER — Ambulatory Visit: Admitting: Family Medicine

## 2023-07-08 ENCOUNTER — Ambulatory Visit (INDEPENDENT_AMBULATORY_CARE_PROVIDER_SITE_OTHER): Admitting: Family Medicine

## 2023-07-08 ENCOUNTER — Encounter: Payer: Self-pay | Admitting: Family Medicine

## 2023-07-08 VITALS — BP 138/64 | HR 68 | Temp 98.2°F | Ht 65.0 in | Wt 154.8 lb

## 2023-07-08 DIAGNOSIS — Z8639 Personal history of other endocrine, nutritional and metabolic disease: Secondary | ICD-10-CM | POA: Diagnosis not present

## 2023-07-08 LAB — POCT GLYCOSYLATED HEMOGLOBIN (HGB A1C): Hemoglobin A1C: 6.2 % — AB (ref 4.0–5.6)

## 2023-07-08 MED ORDER — PRAVASTATIN SODIUM 20 MG PO TABS: 20.0000 mg | ORAL_TABLET | Freq: Every day | ORAL | 3 refills | Status: AC

## 2023-07-08 NOTE — Progress Notes (Unsigned)
 1cm irritated lesion on the R thigh.  He thought he had a possible insect bite.  It may already be healing.  Routine cautions d/w pt.    He fell at home.  He was coming down the stairs early one AM, slipped.  Had eval done, neg xray.  Bruising resolved.  Cautions d/w pt.  This event was wintertime, external and slick steps- the steps were icy.    His wife has neurology appointment today, d/w pt.    H/o DM2.   A1c improved, 6.2.  d/w pt at OV.  Still on statin.  He is working on diet.  He is walking for exercise.    Meds, vitals, and allergies reviewed.   ROS: Per HPI unless specifically indicated in ROS section   Nad Ncat Neck supple, no LA Rrr Ctab Abd soft not ttp.  1cm irritated lesion on the R thigh. No ulceration.  This is benign appearing. He has chronic bony changes on the B fingers and B shoulders.

## 2023-07-08 NOTE — Patient Instructions (Signed)
 Recheck at a yearly visit in December.  Labs at the visit.  Update me as needed.  Take care.  Glad to see you.

## 2023-07-11 NOTE — Assessment & Plan Note (Signed)
 H/o DM2.   A1c improved, 6.2.  d/w pt at OV.  Still on statin.  He is working on diet.  He is walking for exercise.   No change in meds. Recheck at a yearly visit in December.  Labs at the visit.

## 2023-09-24 ENCOUNTER — Ambulatory Visit (INDEPENDENT_AMBULATORY_CARE_PROVIDER_SITE_OTHER): Payer: Medicare Other

## 2023-09-24 VITALS — Ht 65.0 in | Wt 154.0 lb

## 2023-09-24 DIAGNOSIS — Z0000 Encounter for general adult medical examination without abnormal findings: Secondary | ICD-10-CM

## 2023-09-24 NOTE — Patient Instructions (Signed)
 Roger [name] , Thank you for taking time out of your busy schedule to complete your Annual Wellness Visit with me. I enjoyed our conversation and look forward to speaking with you again next year. I, as well as your care team,  appreciate your ongoing commitment to your health goals. Please review the following plan we discussed and let me know if I can assist you in the future. Your Game plan/ To Do List    Referrals: If you haven't heard from the office you've been referred to, please reach out to them at the phone provided.   Follow up Visits: We will see or speak with you next year for your Next Medicare AWV with our clinical staff-09/24/24 @ 11:30am televisit Have you seen your provider in the last 6 months (3 months if uncontrolled diabetes)? Yes  Clinician Recommendations:  Aim for 30 minutes of exercise or brisk walking, 6-8 glasses of water, and 5 servings of fruits and vegetables each day.       This is a list of the screenings recommended for you:  Health Maintenance  Topic Date Due   DTaP/Tdap/Td vaccine (1 - Tdap) Never done   Zoster (Shingles) Vaccine (1 of 2) 06/08/1992   Yearly kidney health urinalysis for diabetes  03/30/2010   Eye exam for diabetics  08/10/2021   Flu Shot  08/23/2023   Yearly kidney function blood test for diabetes  11/05/2023   Complete foot exam   11/05/2023   Hemoglobin A1C  01/07/2024   Medicare Annual Wellness Visit  09/23/2024   Pneumococcal Vaccine for age over 70  Completed   HPV Vaccine  Aged Out   Meningitis B Vaccine  Aged Out   COVID-19 Vaccine  Discontinued    Advanced directives: (Copy Requested) Please bring a copy of your health care power of attorney and living will to the office to be added to your chart at your convenience. You can mail to [facility] [address] [place], [place] [address] or email to [email or web address] Advance Care Planning is important because it:  [x]  Makes sure you receive the  medical care that is consistent with your values, goals, and preferences  [x]  It provides guidance to your family and loved ones and reduces their decisional burden about whether or not they are making the right decisions based on your wishes.  Follow the link provided in your after visit summary or read over the paperwork we have mailed to you to help you started getting your Advance Directives in place. If you need assistance in completing these, please reach out to us  so that we can help you!

## 2023-09-24 NOTE — Progress Notes (Signed)
 Subjective:   Roger [name] is a 83 y.o. who presents for a Medicare Wellness preventive visit.  As a reminder, Annual Wellness Visits don't include a physical exam, and some assessments may be limited, especially if this visit is performed virtually. We may recommend an in-person follow-up visit with your provider if needed.  Visit Complete: Virtual I connected with  Roger [name] on 09/24/23 by a audio enabled telemedicine application and verified that I am speaking with the correct person using two identifiers.  Patient Location: Home  Provider Location: Office/Clinic  I discussed the limitations of evaluation and management by telemedicine. The patient expressed understanding and agreed to proceed.  Vital Signs: Because this visit was a virtual/telehealth visit, some criteria may be missing or patient reported. Any vitals not documented were not able to be obtained and vitals that have been documented are patient reported.  VideoDeclined- This patient declined Librarian, academic. Therefore the visit was completed with audio only.  Persons Participating in Visit: Patient.  AWV Questionnaire: No: Patient Medicare AWV questionnaire was not completed prior to this visit.  Cardiac Risk Factors include: advanced age (>7men, >93 women);dyslipidemia;male gender     Objective:    Today's Vitals   09/24/23 1138  Weight: 154 lb (69.9 kg)  Height: 5' 5 (1.651 m)   Body mass index is 25.63 kg/m.     09/24/2023   11:52 AM 09/13/2022   10:06 AM 09/11/2021   11:01 AM 05/16/2021    8:27 PM 07/30/2019    9:51 AM 06/02/2015    1:14 PM  Advanced Directives  Does Patient Have a Medical Advance Directive? Yes No No No No Yes   Type of Estate agent of [place];Living will     Healthcare Power of [place];Living will   Does patient want to make changes to medical advance directive?      No - Patient declined   Copy of  Healthcare Power of Attorney in Chart? No - copy requested     No - copy requested   Would patient like information on creating a medical advance directive?  No - Patient declined No - Patient declined No - Patient declined No - Patient declined      Data saved with a previous flowsheet row definition    Current Medications (verified) Outpatient Encounter Medications as of 09/24/2023  Medication Sig   Multiple Vitamins-Minerals (PRESERVISION AREDS 2+MULTI VIT PO) Take by mouth 2 (two) times daily.   pravastatin  (PRAVACHOL ) 20 MG tablet Take 1 tablet (20 mg total) by mouth daily.   No facility-administered encounter medications on file as of 09/24/2023.    Allergies (verified) Patient has no known allergies.   History: Past Medical History:  Diagnosis Date   Diabetes mellitus    h/o Type 2/no meds   Hyperlipidemia    Skin cancer    on back   Past Surgical History:  Procedure Laterality Date   APPENDECTOMY  7th grade   COLONOSCOPY     Melanoma Excision of back  1996   Dr. 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[employer])  Tobacco Use   Smoking status: Never   Smokeless tobacco: Never  Substance and Sexual Activity   Alcohol use: Yes    Alcohol/week: 0.0 standard drinks of alcohol    Comment: rare   Drug use: No   Sexual activity: Not on file  Other Topics Concern   Not on file  Social History Narrative   Regular exercise - walking frequently   Married 1962   Twin daughters, 3 grandkids and 9 great grandkids   Splits time between [place] and [address] and [place]    Working Retail banker   Social Drivers of Corporate investment banker Strain: Low Risk  (09/24/2023)   Overall Financial Resource Strain (CARDIA)    Difficulty of Paying Living Expenses: Not hard at all  Food Insecurity: No Food Insecurity (09/24/2023)   Hunger Vital Sign    Worried About Running Out of Food in the Last Year: Never true    Ran Out of Food in the Last Year: Never true  Transportation Needs: No Transportation Needs (09/24/2023)   PRAPARE - Administrator, Civil Service (Medical): No    Lack of Transportation (Non-Medical): No  Physical Activity: Insufficiently Active (09/24/2023)   Exercise Vital Sign    Days of Exercise per Week: 3 days    Minutes of Exercise per Session: 30 min  Stress: No Stress Concern Present (09/24/2023)   [employer] of Occupational Health - Occupational Stress Questionnaire    Feeling of Stress: Not at all  Social Connections: Moderately Integrated (09/24/2023)   Social Connection and Isolation Panel    Frequency of Communication with Friends and Family: More than three times a week    Frequency of Social Gatherings with Friends and Family: More than three times a week    Attends Religious Services: More than 4 times per year    Active Member of [employer] or Organizations: No    Attends Engineer, structural: Never    Marital Status: Married    Tobacco Counseling Counseling given: Not Answered    Clinical Intake:  Pre-visit preparation completed: Yes  Pain : No/denies pain     BMI - recorded: 25.63 Nutritional Status: BMI 25 -29 Overweight Nutritional Risks: None Diabetes: No  Lab Results  Component Value Date   HGBA1C 6.2 (A) 07/08/2023   HGBA1C 6.5 11/05/2022   HGBA1C 6.3 (A) 02/26/2022     How often do you need to have someone help you when you read instructions, pamphlets, or other written materials from your doctor or pharmacy?: 1 - Never  Interpreter Needed?: No  Comments: lives with  wife Information entered by :: B.Candee Hoon,LPN   Activities of Daily Living     09/24/2023   11:53 AM  In your present state of health, do you have any difficulty performing the following activities:  Hearing? 0  Vision? 0  Difficulty concentrating or making decisions? 0  Walking or climbing stairs? 0  Dressing or bathing? 0  Doing errands, shopping? 0  Preparing Food and eating ? N  Using the Toilet? N  In the past six months, have you accidently leaked urine? N  Do you have problems with loss of bowel control? N  Managing your Medications? N  Managing your  Finances? N  Housekeeping or managing your Housekeeping? N    Patient Care Team: Cleatus Arlyss RAMAN, MD as PCP - General Trudy Righter, DC as Referring Physician (Chiropractic Medicine) [place], Heather, OD (Optometry) Alvia Norleen BIRCH, MD as Consulting Physician (Ophthalmology) Loco, [facility] Tobie Baptist, MD as Consulting Physician (Ophthalmology)  I have updated your Care Teams any recent Medical Services you may have received from other providers in the past year.     Assessment:   This is a routine wellness examination for Roger [name].  Hearing/Vision screen Hearing Screening - Comments:: Patient denies any hearing difficulties.   Vision Screening - Comments:: Pt says their vision is good without glasses;readers only Dr  Dr Patel/Dr Burundi   Goals Addressed             This Visit's Progress    COMPLETED: DIET - EAT MORE FRUITS AND VEGETABLES   On track    Patient Stated       09/24/23-Maintain current health status.       Depression Screen     09/24/2023   11:49 AM 09/13/2022   10:02 AM 06/11/2022    8:35 AM 02/26/2022    9:56 AM 09/11/2021   10:59 AM 12/26/2020    2:13 PM 12/07/2019    8:17 AM  PHQ 2/9 Scores  PHQ - 2 Score 0 0 0 0 0 0 0  PHQ- 9 Score   0 0 0      Fall Risk     09/24/2023   11:47 AM 09/13/2022   10:05 AM 06/11/2022    8:34 AM 02/26/2022    9:56 AM 09/11/2021   11:02 AM  Fall Risk    Falls in the past year? 0 0 0 0 0  Number falls in past yr: 0 0 0 0 0  Injury with Fall? 0 0 0 0 0  Risk for fall due to : No Fall Risks No Fall Risks No Fall Risks No Fall Risks No Fall Risks  Follow up Falls prevention discussed;Education provided Falls prevention discussed;Falls evaluation completed Falls evaluation completed Falls evaluation completed Falls evaluation completed      Data saved with a previous flowsheet row definition    MEDICARE RISK AT HOME:  Medicare Risk at Home Any stairs in or around the home?: Yes If so, are there any without handrails?: Yes Home free of loose throw rugs in walkways, pet beds, electrical cords, etc?: Yes Adequate lighting in your home to reduce risk of falls?: Yes Life alert?: No Use of a cane, walker or w/c?: No Grab bars in the bathroom?: Yes Shower chair or bench in shower?: Yes Elevated toilet seat or a handicapped toilet?: Yes  TIMED UP AND GO:  Was the test performed?  No  Cognitive Function: 6CIT completed    07/30/2019    9:56 AM  MMSE - Mini Mental State Exam  Not completed: Refused        09/24/2023   11:53 AM 09/13/2022   10:08 AM 09/11/2021   11:02 AM  6CIT Screen  What Year? 0 points 0 points 0 points  What month? 0 points 0 points 0 points  What time? 0 points 0 points 0 points  Count back from 20 0 points 0 points 0 points  Months in reverse 0 points 0 points 0 points  Repeat phrase 0 points 0 points 0 points  Total Score 0 points 0 points 0 points    Immunizations Immunization History  Administered Date(s) Administered  Fluad Quad(high Dose 65+) 01/05/2019, 12/07/2019, 12/26/2020   Influenza Whole 10/04/2009   Influenza,inj,Quad PF,6+ Mos 10/06/2013, 12/13/2014, 12/19/2015, 11/26/2017   Influenza-Unspecified 10/07/2015   PFIZER(Purple Top)SARS-COV-2 Vaccination 04/13/2019, 05/06/2019   Pneumococcal Conjugate-13 12/13/2014   Pneumococcal Polysaccharide-23 09/15/2012   Zoster, Live 09/04/2011     Screening Tests Health Maintenance  Topic Date Due   DTaP/Tdap/Td (1 - Tdap) Never done   Zoster Vaccines- Shingrix (1 of 2) 06/08/1992   Diabetic kidney evaluation - Urine ACR  03/30/2010   OPHTHALMOLOGY EXAM  08/10/2021   INFLUENZA VACCINE  08/23/2023   Diabetic kidney evaluation - eGFR measurement  11/05/2023   FOOT EXAM  11/05/2023   HEMOGLOBIN A1C  01/07/2024   Medicare Annual Wellness (AWV)  09/23/2024   Pneumococcal Vaccine: 50+ Years  Completed   HPV VACCINES  Aged Out   Meningococcal B Vaccine  Aged Out   COVID-19 Vaccine  Discontinued    Health Maintenance  Health Maintenance Due  Topic Date Due   DTaP/Tdap/Td (1 - Tdap) Never done   Zoster Vaccines- Shingrix (1 of 2) 06/08/1992   Diabetic kidney evaluation - Urine ACR  03/30/2010   OPHTHALMOLOGY EXAM  08/10/2021   INFLUENZA VACCINE  08/23/2023   Health Maintenance Items Addressed: None due at this time. Pt will receive vaccines at their pharmacy or in office when decided to obtain   Additional Screening:  Vision Screening: Recommended annual ophthalmology exams for early detection of glaucoma and other disorders of the eye. Would you like a referral to an eye doctor? No    Dental Screening: Recommended annual dental exams for proper oral hygiene  Community Resource Referral / Chronic Care Management: CRR required this visit?  No   CCM required this visit?  No   Plan:    I have personally reviewed and noted the following in the patient's chart:   Medical and social history Use of alcohol, tobacco or illicit drugs  Current medications and supplements including opioid prescriptions. Patient is not currently taking opioid prescriptions. Functional ability and status Nutritional status Physical activity Advanced directives List of other physicians Hospitalizations, surgeries, and ER visits in previous 12 months Vitals Screenings to include cognitive, depression, and falls Referrals and  appointments  In addition, I have reviewed and discussed with patient certain preventive protocols, quality metrics, and best practice recommendations. A written personalized care plan for preventive services as well as general preventive health recommendations were provided to patient.   Erminio LITTIE Saris, LPN   [id number]/7972   After Visit Summary: (Declined) Due to this being a telephonic visit, with patients personalized plan was offered to patient but patient Declined AVS at this time   Notes: Nothing significant to report at this time.

## 2023-11-18 LAB — OPHTHALMOLOGY REPORT-SCANNED

## 2023-12-23 ENCOUNTER — Encounter: Payer: Self-pay | Admitting: Family Medicine

## 2023-12-23 ENCOUNTER — Ambulatory Visit: Admitting: Family Medicine

## 2023-12-23 VITALS — BP 130/60 | HR 68 | Temp 98.5°F | Ht 64.5 in | Wt 163.0 lb

## 2023-12-23 DIAGNOSIS — Z7189 Other specified counseling: Secondary | ICD-10-CM

## 2023-12-23 DIAGNOSIS — Z8639 Personal history of other endocrine, nutritional and metabolic disease: Secondary | ICD-10-CM | POA: Diagnosis not present

## 2023-12-23 DIAGNOSIS — E785 Hyperlipidemia, unspecified: Secondary | ICD-10-CM | POA: Diagnosis not present

## 2023-12-23 DIAGNOSIS — Z0000 Encounter for general adult medical examination without abnormal findings: Secondary | ICD-10-CM

## 2023-12-23 LAB — COMPREHENSIVE METABOLIC PANEL WITH GFR
ALT: 22 U/L (ref 0–53)
AST: 17 U/L (ref 0–37)
Albumin: 4.7 g/dL (ref 3.5–5.2)
Alkaline Phosphatase: 51 U/L (ref 39–117)
BUN: 14 mg/dL (ref 6–23)
CO2: 32 meq/L (ref 19–32)
Calcium: 9.7 mg/dL (ref 8.4–10.5)
Chloride: 102 meq/L (ref 96–112)
Creatinine, Ser: 0.54 mg/dL (ref 0.40–1.50)
GFR: 93.44 mL/min
Glucose, Bld: 155 mg/dL — ABNORMAL HIGH (ref 70–99)
Potassium: 4.5 meq/L (ref 3.5–5.1)
Sodium: 140 meq/L (ref 135–145)
Total Bilirubin: 1 mg/dL (ref 0.2–1.2)
Total Protein: 6.9 g/dL (ref 6.0–8.3)

## 2023-12-23 LAB — MICROALBUMIN / CREATININE URINE RATIO
Creatinine,U: 104.6 mg/dL
Microalb Creat Ratio: 13.1 mg/g (ref 0.0–30.0)
Microalb, Ur: 1.4 mg/dL (ref 0.0–1.9)

## 2023-12-23 LAB — CBC WITH DIFFERENTIAL/PLATELET
Basophils Absolute: 0 K/uL (ref 0.0–0.1)
Basophils Relative: 0.7 % (ref 0.0–3.0)
Eosinophils Absolute: 0.2 K/uL (ref 0.0–0.7)
Eosinophils Relative: 3.4 % (ref 0.0–5.0)
HCT: 41 % (ref 39.0–52.0)
Hemoglobin: 14.3 g/dL (ref 13.0–17.0)
Lymphocytes Relative: 25.5 % (ref 12.0–46.0)
Lymphs Abs: 1.3 K/uL (ref 0.7–4.0)
MCHC: 34.9 g/dL (ref 30.0–36.0)
MCV: 89.2 fl (ref 78.0–100.0)
Monocytes Absolute: 0.6 K/uL (ref 0.1–1.0)
Monocytes Relative: 11.3 % (ref 3.0–12.0)
Neutro Abs: 3.1 K/uL (ref 1.4–7.7)
Neutrophils Relative %: 59.1 % (ref 43.0–77.0)
Platelets: 141 K/uL — ABNORMAL LOW (ref 150.0–400.0)
RBC: 4.59 Mil/uL (ref 4.22–5.81)
RDW: 13.4 % (ref 11.5–15.5)
WBC: 5.2 K/uL (ref 4.0–10.5)

## 2023-12-23 LAB — LIPID PANEL
Cholesterol: 126 mg/dL (ref 0–200)
HDL: 44.6 mg/dL
LDL Cholesterol: 73 mg/dL (ref 0–99)
NonHDL: 81.55
Total CHOL/HDL Ratio: 3
Triglycerides: 45 mg/dL (ref 0.0–149.0)
VLDL: 9 mg/dL (ref 0.0–40.0)

## 2023-12-23 LAB — HEMOGLOBIN A1C: Hgb A1c MFr Bld: 6.4 % (ref 4.6–6.5)

## 2023-12-23 NOTE — Progress Notes (Unsigned)
 His wife needs extra help, d/w pt.  I thanked him for his help.    Elevated Cholesterol: Using medications without problems: yes Muscle aches: no Diet compliance: yes Exercise:yes  Compliant with CPAP per outside clinic.   H/o DM2.  Last A1c <6.5.  eye exam done last month.  [facility] [address] [place], [place] [address]  Flu discussed with patient, encouraged.   Shingles vaccine discussed with patient.  Status post Zostavax. PNA up-to-date Tetanus may be cheaper at pharmacy, discussed with patient. RSV d/w pt 2025.   COVID-vaccine prev d/w pt.   Colonoscopy 2017, not due after age 38.   Prostate cancer screening and PSA options (with potential risks and benefits of testing vs not testing) were discussed along with recent recs/guidelines.  He declined testing PSA at this point. Advance directive-wife designated patient were incapacitated. If needed, would then have both daughters designated.    Meds, vitals, and allergies reviewed.   ROS: Per HPI unless specifically indicated in ROS section   GEN: nad, alert and oriented HEENT: mucous membranes moist NECK: supple w/o LA CV: rrr.  no murmur PULM: ctab, no inc wob ABD: soft, +bs EXT: no edema SKIN: well perfused.   Diabetic foot exam: Normal inspection No skin breakdown No calluses  Normal DP pulses Normal sensation to light touch and monofilament Nails normal

## 2023-12-23 NOTE — Patient Instructions (Addendum)
 Go to the lab on the way out.   If you have mychart we'll likely use that to update you.    Take care.  Glad to see you. Recheck in about 6 months.  A1c at the visit.  You don't need to fast.

## 2023-12-25 NOTE — Assessment & Plan Note (Signed)
 History of, continue work on diet and exercise.  See notes on labs.

## 2023-12-25 NOTE — Assessment & Plan Note (Signed)
Advance directive-wife designated patient were incapacitated. If needed, would then have both daughters designated.

## 2023-12-25 NOTE — Assessment & Plan Note (Signed)
 Can you pravastatin .  Continue work on diet and exercise.  See notes on labs.

## 2023-12-25 NOTE — Assessment & Plan Note (Signed)
 Flu discussed with patient, encouraged.   Shingles vaccine discussed with patient.  Status post Zostavax. PNA up-to-date Tetanus may be cheaper at pharmacy, discussed with patient. RSV d/w pt 2025.   COVID-vaccine prev d/w pt.   Colonoscopy 2017, not due after age 83.   Prostate cancer screening and PSA options (with potential risks and benefits of testing vs not testing) were discussed along with recent recs/guidelines.  He declined testing PSA at this point. Advance directive-wife designated patient were incapacitated. If needed, would then have both daughters designated.

## 2023-12-29 ENCOUNTER — Ambulatory Visit: Payer: Self-pay | Admitting: Family Medicine

## 2024-06-22 ENCOUNTER — Ambulatory Visit: Admitting: Family Medicine

## 2024-09-24 ENCOUNTER — Ambulatory Visit

## 2024-09-25 ENCOUNTER — Ambulatory Visit
